# Patient Record
Sex: Female | Born: 1965 | Race: Black or African American | Hispanic: No | Marital: Married | Smoking: Never smoker
Health system: Southern US, Community
[De-identification: ages and names within clinical notes are randomized; demographics above are authoritative.]

## PROBLEM LIST (undated history)

## (undated) ENCOUNTER — Inpatient Hospital Stay: Admission: EM | Payer: Self-pay | Source: Home / Self Care

## (undated) DIAGNOSIS — G2581 Restless legs syndrome: Secondary | ICD-10-CM

## (undated) DIAGNOSIS — I10 Essential (primary) hypertension: Secondary | ICD-10-CM

## (undated) DIAGNOSIS — F419 Anxiety disorder, unspecified: Secondary | ICD-10-CM

## (undated) DIAGNOSIS — F329 Major depressive disorder, single episode, unspecified: Secondary | ICD-10-CM

## (undated) DIAGNOSIS — F32A Depression, unspecified: Secondary | ICD-10-CM

## (undated) DIAGNOSIS — Z972 Presence of dental prosthetic device (complete) (partial): Secondary | ICD-10-CM

## (undated) DIAGNOSIS — M1990 Unspecified osteoarthritis, unspecified site: Secondary | ICD-10-CM

## (undated) DIAGNOSIS — S7400XA Injury of sciatic nerve at hip and thigh level, unspecified leg, initial encounter: Secondary | ICD-10-CM

## (undated) DIAGNOSIS — K029 Dental caries, unspecified: Secondary | ICD-10-CM

## (undated) DIAGNOSIS — T754XXA Electrocution, initial encounter: Secondary | ICD-10-CM

## (undated) DIAGNOSIS — Z973 Presence of spectacles and contact lenses: Secondary | ICD-10-CM

## (undated) DIAGNOSIS — K219 Gastro-esophageal reflux disease without esophagitis: Secondary | ICD-10-CM

## (undated) DIAGNOSIS — N809 Endometriosis, unspecified: Secondary | ICD-10-CM

## (undated) HISTORY — DX: Depression, unspecified: F32.A

## (undated) HISTORY — PX: TUBAL LIGATION: SHX77

## (undated) HISTORY — DX: Major depressive disorder, single episode, unspecified: F32.9

## (undated) HISTORY — PX: MULTIPLE TOOTH EXTRACTIONS: SHX2053

## (undated) HISTORY — DX: Anxiety disorder, unspecified: F41.9

---

## 1999-02-25 ENCOUNTER — Other Ambulatory Visit: Admission: RE | Admit: 1999-02-25 | Discharge: 1999-02-25 | Payer: Self-pay | Admitting: Obstetrics

## 1999-03-14 ENCOUNTER — Observation Stay (HOSPITAL_COMMUNITY): Admission: AD | Admit: 1999-03-14 | Discharge: 1999-03-15 | Payer: Self-pay | Admitting: Obstetrics

## 1999-03-22 ENCOUNTER — Observation Stay (HOSPITAL_COMMUNITY): Admission: AD | Admit: 1999-03-22 | Discharge: 1999-03-23 | Payer: Self-pay | Admitting: Obstetrics

## 1999-04-02 ENCOUNTER — Observation Stay (HOSPITAL_COMMUNITY): Admission: AD | Admit: 1999-04-02 | Discharge: 1999-04-03 | Payer: Self-pay | Admitting: Obstetrics

## 1999-06-21 ENCOUNTER — Inpatient Hospital Stay (HOSPITAL_COMMUNITY): Admission: AD | Admit: 1999-06-21 | Discharge: 1999-06-21 | Payer: Self-pay | Admitting: Obstetrics

## 1999-10-05 ENCOUNTER — Inpatient Hospital Stay (HOSPITAL_COMMUNITY): Admission: AD | Admit: 1999-10-05 | Discharge: 1999-10-05 | Payer: Self-pay | Admitting: Obstetrics

## 1999-10-06 ENCOUNTER — Inpatient Hospital Stay (HOSPITAL_COMMUNITY): Admission: AD | Admit: 1999-10-06 | Discharge: 1999-10-06 | Payer: Self-pay | Admitting: Obstetrics

## 1999-10-20 ENCOUNTER — Inpatient Hospital Stay (HOSPITAL_COMMUNITY): Admission: AD | Admit: 1999-10-20 | Discharge: 1999-10-23 | Payer: Self-pay | Admitting: Obstetrics

## 2000-02-16 ENCOUNTER — Emergency Department (HOSPITAL_COMMUNITY): Admission: EM | Admit: 2000-02-16 | Discharge: 2000-02-16 | Payer: Self-pay

## 2002-11-20 ENCOUNTER — Inpatient Hospital Stay (HOSPITAL_COMMUNITY): Admission: AD | Admit: 2002-11-20 | Discharge: 2002-11-22 | Payer: Self-pay | Admitting: Obstetrics

## 2002-11-21 ENCOUNTER — Encounter: Payer: Self-pay | Admitting: Obstetrics

## 2002-11-21 ENCOUNTER — Other Ambulatory Visit: Admission: RE | Admit: 2002-11-21 | Discharge: 2002-11-21 | Payer: Self-pay | Admitting: Obstetrics

## 2003-06-06 ENCOUNTER — Inpatient Hospital Stay (HOSPITAL_COMMUNITY): Admission: AD | Admit: 2003-06-06 | Discharge: 2003-06-06 | Payer: Self-pay | Admitting: Obstetrics

## 2003-06-26 ENCOUNTER — Ambulatory Visit (HOSPITAL_COMMUNITY): Admission: RE | Admit: 2003-06-26 | Discharge: 2003-06-26 | Payer: Self-pay | Admitting: Obstetrics

## 2003-10-02 ENCOUNTER — Emergency Department (HOSPITAL_COMMUNITY): Admission: EM | Admit: 2003-10-02 | Discharge: 2003-10-02 | Payer: Self-pay | Admitting: Emergency Medicine

## 2004-01-14 ENCOUNTER — Inpatient Hospital Stay (HOSPITAL_COMMUNITY): Admission: AD | Admit: 2004-01-14 | Discharge: 2004-01-14 | Payer: Self-pay | Admitting: Obstetrics

## 2004-02-03 ENCOUNTER — Ambulatory Visit (HOSPITAL_COMMUNITY): Admission: RE | Admit: 2004-02-03 | Discharge: 2004-02-03 | Payer: Self-pay | Admitting: Obstetrics

## 2004-02-28 ENCOUNTER — Encounter: Admission: RE | Admit: 2004-02-28 | Discharge: 2004-02-28 | Payer: Self-pay | Admitting: Neurology

## 2004-09-11 ENCOUNTER — Emergency Department (HOSPITAL_COMMUNITY): Admission: EM | Admit: 2004-09-11 | Discharge: 2004-09-11 | Payer: Self-pay | Admitting: Emergency Medicine

## 2005-04-03 ENCOUNTER — Emergency Department (HOSPITAL_COMMUNITY): Admission: EM | Admit: 2005-04-03 | Discharge: 2005-04-03 | Payer: Self-pay | Admitting: Emergency Medicine

## 2007-04-13 ENCOUNTER — Encounter: Payer: Self-pay | Admitting: Anesthesiology

## 2007-04-29 ENCOUNTER — Encounter: Payer: Self-pay | Admitting: Anesthesiology

## 2010-12-23 ENCOUNTER — Encounter: Payer: Medicaid Other | Attending: Physical Medicine & Rehabilitation

## 2010-12-23 ENCOUNTER — Ambulatory Visit (HOSPITAL_BASED_OUTPATIENT_CLINIC_OR_DEPARTMENT_OTHER): Payer: Medicaid Other | Admitting: Physical Medicine & Rehabilitation

## 2010-12-23 DIAGNOSIS — F3289 Other specified depressive episodes: Secondary | ICD-10-CM | POA: Insufficient documentation

## 2010-12-23 DIAGNOSIS — M549 Dorsalgia, unspecified: Secondary | ICD-10-CM | POA: Insufficient documentation

## 2010-12-23 DIAGNOSIS — M79609 Pain in unspecified limb: Secondary | ICD-10-CM | POA: Insufficient documentation

## 2010-12-23 DIAGNOSIS — F329 Major depressive disorder, single episode, unspecified: Secondary | ICD-10-CM | POA: Insufficient documentation

## 2010-12-23 DIAGNOSIS — F411 Generalized anxiety disorder: Secondary | ICD-10-CM | POA: Insufficient documentation
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
## 2010-12-31 ENCOUNTER — Ambulatory Visit: Payer: Self-pay | Admitting: Physical Medicine & Rehabilitation

## 2011-01-24 ENCOUNTER — Encounter: Payer: Medicaid Other | Attending: Physical Medicine & Rehabilitation

## 2011-01-24 ENCOUNTER — Ambulatory Visit: Payer: Medicaid Other | Admitting: Physical Medicine & Rehabilitation

## 2011-01-24 DIAGNOSIS — M549 Dorsalgia, unspecified: Secondary | ICD-10-CM | POA: Insufficient documentation

## 2011-01-24 DIAGNOSIS — F411 Generalized anxiety disorder: Secondary | ICD-10-CM | POA: Insufficient documentation

## 2011-01-24 DIAGNOSIS — F329 Major depressive disorder, single episode, unspecified: Secondary | ICD-10-CM | POA: Insufficient documentation

## 2011-01-24 DIAGNOSIS — F3289 Other specified depressive episodes: Secondary | ICD-10-CM | POA: Insufficient documentation

## 2011-01-24 DIAGNOSIS — M79609 Pain in unspecified limb: Secondary | ICD-10-CM | POA: Insufficient documentation

## 2011-06-18 ENCOUNTER — Encounter (HOSPITAL_COMMUNITY): Payer: Self-pay | Admitting: *Deleted

## 2011-06-18 ENCOUNTER — Emergency Department (HOSPITAL_COMMUNITY): Payer: Medicaid Other

## 2011-06-18 ENCOUNTER — Emergency Department (HOSPITAL_COMMUNITY)
Admission: EM | Admit: 2011-06-18 | Discharge: 2011-06-18 | Disposition: A | Discharge status: Home / Self Care | Payer: Medicaid Other | Attending: Emergency Medicine | Admitting: Emergency Medicine

## 2011-06-18 DIAGNOSIS — S60221A Contusion of right hand, initial encounter: Secondary | ICD-10-CM

## 2011-06-18 DIAGNOSIS — M545 Low back pain, unspecified: Secondary | ICD-10-CM | POA: Insufficient documentation

## 2011-06-18 DIAGNOSIS — IMO0002 Reserved for concepts with insufficient information to code with codable children: Secondary | ICD-10-CM | POA: Insufficient documentation

## 2011-06-18 DIAGNOSIS — F341 Dysthymic disorder: Secondary | ICD-10-CM | POA: Insufficient documentation

## 2011-06-18 DIAGNOSIS — S161XXA Strain of muscle, fascia and tendon at neck level, initial encounter: Secondary | ICD-10-CM

## 2011-06-18 DIAGNOSIS — S335XXA Sprain of ligaments of lumbar spine, initial encounter: Secondary | ICD-10-CM | POA: Insufficient documentation

## 2011-06-18 DIAGNOSIS — Z79899 Other long term (current) drug therapy: Secondary | ICD-10-CM | POA: Insufficient documentation

## 2011-06-18 DIAGNOSIS — Y92410 Unspecified street and highway as the place of occurrence of the external cause: Secondary | ICD-10-CM | POA: Insufficient documentation

## 2011-06-18 DIAGNOSIS — S46912A Strain of unspecified muscle, fascia and tendon at shoulder and upper arm level, left arm, initial encounter: Secondary | ICD-10-CM

## 2011-06-18 DIAGNOSIS — S39012A Strain of muscle, fascia and tendon of lower back, initial encounter: Secondary | ICD-10-CM

## 2011-06-18 DIAGNOSIS — S139XXA Sprain of joints and ligaments of unspecified parts of neck, initial encounter: Secondary | ICD-10-CM | POA: Insufficient documentation

## 2011-06-18 DIAGNOSIS — S60229A Contusion of unspecified hand, initial encounter: Secondary | ICD-10-CM | POA: Insufficient documentation
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
## 2011-06-18 NOTE — ED Notes (Signed)
Pt resting quietly at the time. 6/10 pain at the time. Vital signs stable. No signs of distress noted.

## 2011-06-18 NOTE — Discharge Instructions (Signed)
SEEK IMMEDIATE MEDICAL ATTENTION IF: °New numbness, tingling, weakness, or problem with the use of your arms or legs.  °Severe back pain not relieved with medications.  °Change in bowel or bladder control.  °Increasing pain in any areas of the body (such as chest or abdominal pain).  °Shortness of breath, dizziness or fainting.  °Nausea (feeling sick to your stomach), vomiting, fever, or sweats. °You have neck pain, possibly from a cervical strain and/or pinched nerve.  °SEEK IMMEDIATE MEDICAL ATTENTION IF: °You develop difficulties swallowing or breathing.  °You have new or worse numbness, weakness, tingling, or movement problems in your arms or legs.  °You develop increasing pain which is uncontrolled with medications.  °You have change in bowel or bladder function, or other concerns. °
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
## 2011-07-02 ENCOUNTER — Emergency Department (HOSPITAL_COMMUNITY)
Admission: EM | Admit: 2011-07-02 | Discharge: 2011-07-02 | Disposition: A | Discharge status: Home / Self Care | Payer: No Typology Code available for payment source | Attending: Emergency Medicine | Admitting: Emergency Medicine

## 2011-07-02 ENCOUNTER — Encounter (HOSPITAL_COMMUNITY): Payer: Self-pay | Admitting: *Deleted

## 2011-07-02 ENCOUNTER — Emergency Department (HOSPITAL_COMMUNITY): Payer: No Typology Code available for payment source

## 2011-07-02 DIAGNOSIS — D219 Benign neoplasm of connective and other soft tissue, unspecified: Secondary | ICD-10-CM

## 2011-07-02 DIAGNOSIS — M545 Low back pain, unspecified: Secondary | ICD-10-CM | POA: Insufficient documentation

## 2011-07-02 DIAGNOSIS — M5430 Sciatica, unspecified side: Secondary | ICD-10-CM | POA: Insufficient documentation

## 2011-07-02 DIAGNOSIS — M79609 Pain in unspecified limb: Secondary | ICD-10-CM | POA: Insufficient documentation

## 2011-07-02 DIAGNOSIS — D259 Leiomyoma of uterus, unspecified: Secondary | ICD-10-CM | POA: Insufficient documentation

## 2011-07-02 DIAGNOSIS — R109 Unspecified abdominal pain: Secondary | ICD-10-CM | POA: Insufficient documentation

## 2011-07-02 LAB — BASIC METABOLIC PANEL
BUN: 10 mg/dL (ref 6–23)
CO2: 28 mEq/L (ref 19–32)
Calcium: 9 mg/dL (ref 8.4–10.5)
Chloride: 103 mEq/L (ref 96–112)
Creatinine, Ser: 0.64 mg/dL (ref 0.50–1.10)
GFR calc Af Amer: >90 mL/min (ref >90)
GFR calc non Af Amer: >90 mL/min (ref >90)
Glucose, Bld: 89 mg/dL (ref 70–99)
Potassium: 3.7 mEq/L (ref 3.5–5.1)
Sodium: 137 mEq/L (ref 135–145)

## 2011-07-02 LAB — DIFFERENTIAL
Basophils Absolute: 0 10*3/uL (ref 0.0–0.1)
Basophils Relative: 0 % (ref 0–1)
Eosinophils Absolute: 0.5 10*3/uL (ref 0.0–0.7)
Eosinophils Relative: 5 % (ref 0–5)
Lymphocytes Relative: 23 % (ref 12–46)
Lymphs Abs: 2.3 10*3/uL (ref 0.7–4.0)
Monocytes Absolute: 0.9 10*3/uL (ref 0.1–1.0)
Monocytes Relative: 9 % (ref 3–12)
Neutro Abs: 6.1 10*3/uL (ref 1.7–7.7)
Neutrophils Relative %: 62 % (ref 43–77)

## 2011-07-02 LAB — CBC
HCT: 39.9 % (ref 36.0–46.0)
Hemoglobin: 12.9 g/dL (ref 12.0–15.0)
MCH: 26.1 pg (ref 26.0–34.0)
MCHC: 32.3 g/dL (ref 30.0–36.0)
MCV: 80.8 fL (ref 78.0–100.0)
Platelets: 306 10*3/uL (ref 150–400)
RBC: 4.94 MIL/uL (ref 3.87–5.11)
RDW: 15.1 % (ref 11.5–15.5)
WBC: 9.8 10*3/uL (ref 4.0–10.5)

## 2011-07-02 MED ORDER — PREDNISONE 20 MG PO TABS: ORAL_TABLET | ORAL | Status: DC

## 2011-07-02 MED ORDER — HYDROMORPHONE HCL PF 1 MG/ML IJ SOLN
1.0000 mg | Freq: Once | INTRAMUSCULAR | Status: AC
  Administered 2011-07-02: 1 mg via INTRAVENOUS
  Filled 2011-07-02: qty 1

## 2011-07-02 MED ORDER — IOHEXOL 300 MG/ML  SOLN
20.0000 mL | INTRAMUSCULAR | Status: AC
  Administered 2011-07-02: 20 mL via ORAL

## 2011-07-02 MED ORDER — TRAMADOL HCL 50 MG PO TABS: 50.0000 mg | ORAL_TABLET | Freq: Four times a day (QID) | ORAL | Status: AC | PRN

## 2011-07-02 MED ORDER — OXYCODONE-ACETAMINOPHEN 5-325 MG PO TABS: 1.0000 | ORAL_TABLET | Freq: Four times a day (QID) | ORAL | Status: AC | PRN

## 2011-07-02 MED ORDER — KETOROLAC TROMETHAMINE 30 MG/ML IJ SOLN
30.0000 mg | Freq: Once | INTRAMUSCULAR | Status: AC
  Administered 2011-07-02: 30 mg via INTRAVENOUS
  Filled 2011-07-02: qty 1

## 2011-07-02 MED ORDER — ONDANSETRON HCL 4 MG/2ML IJ SOLN
4.0000 mg | Freq: Once | INTRAMUSCULAR | Status: AC
  Administered 2011-07-02: 4 mg via INTRAVENOUS
  Filled 2011-07-02: qty 2

## 2011-07-02 MED ORDER — LORAZEPAM 2 MG/ML IJ SOLN
1.0000 mg | Freq: Once | INTRAMUSCULAR | Status: AC
  Administered 2011-07-02: 1 mg via INTRAVENOUS
  Filled 2011-07-02: qty 1

## 2011-07-02 MED ORDER — HYDROMORPHONE HCL PF 1 MG/ML IJ SOLN
INTRAMUSCULAR | Status: AC
  Administered 2011-07-02: 12:00:00
  Filled 2011-07-02: qty 1

## 2011-07-02 MED ORDER — HYDROMORPHONE HCL PF 2 MG/ML IJ SOLN
INTRAMUSCULAR | Status: AC
  Filled 2011-07-02: qty 1

## 2011-07-02 MED ORDER — IOHEXOL 300 MG/ML  SOLN
100.0000 mL | Freq: Once | INTRAMUSCULAR | Status: AC | PRN
  Administered 2011-07-02: 100 mL via INTRAVENOUS

## 2011-07-02 MED ORDER — METHYLPREDNISOLONE SODIUM SUCC 125 MG IJ SOLR
125.0000 mg | Freq: Once | INTRAMUSCULAR | Status: AC
  Administered 2011-07-02: 125 mg via INTRAVENOUS
  Filled 2011-07-02: qty 2
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
## 2011-07-02 NOTE — ED Notes (Signed)
Pt reports that she is not having any relief from first round of pain medication. Dr. Estell Harpin informed and given V.O for more pain medication.
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
## 2011-07-02 NOTE — Discharge Instructions (Signed)
Follow up with your md in 1 week for recheck °

## 2011-07-02 NOTE — ED Notes (Signed)
Pt reports that her pain is still 6/10 with minimal relief.  Explained the mechanism of action and duration of the medications that she received and is prescribed.  Pt expresses verbal understanding.  Daughter at bedside to transport pt home due Narcotic administration.
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
## 2011-07-02 NOTE — ED Notes (Signed)
Patient transported to Ultrasound via wheelchair.

## 2011-07-30 ENCOUNTER — Emergency Department (HOSPITAL_COMMUNITY)
Admission: EM | Admit: 2011-07-30 | Discharge: 2011-07-31 | Disposition: A | Discharge status: Home / Self Care | Payer: No Typology Code available for payment source | Attending: Emergency Medicine | Admitting: Emergency Medicine

## 2011-07-30 ENCOUNTER — Encounter (HOSPITAL_COMMUNITY): Payer: Self-pay | Admitting: Emergency Medicine

## 2011-07-30 DIAGNOSIS — M545 Low back pain, unspecified: Secondary | ICD-10-CM | POA: Insufficient documentation

## 2011-07-30 DIAGNOSIS — M79609 Pain in unspecified limb: Secondary | ICD-10-CM | POA: Insufficient documentation

## 2011-07-30 DIAGNOSIS — G8929 Other chronic pain: Secondary | ICD-10-CM | POA: Insufficient documentation

## 2011-07-30 DIAGNOSIS — F329 Major depressive disorder, single episode, unspecified: Secondary | ICD-10-CM | POA: Insufficient documentation

## 2011-07-30 DIAGNOSIS — M549 Dorsalgia, unspecified: Secondary | ICD-10-CM | POA: Insufficient documentation

## 2011-07-30 DIAGNOSIS — S7400XA Injury of sciatic nerve at hip and thigh level, unspecified leg, initial encounter: Secondary | ICD-10-CM | POA: Insufficient documentation

## 2011-07-30 DIAGNOSIS — F3289 Other specified depressive episodes: Secondary | ICD-10-CM | POA: Insufficient documentation

## 2011-07-30 DIAGNOSIS — F411 Generalized anxiety disorder: Secondary | ICD-10-CM | POA: Insufficient documentation

## 2011-07-30 HISTORY — DX: Injury of sciatic nerve at hip and thigh level, unspecified leg, initial encounter: S74.00XA

## 2011-07-30 MED ORDER — DIAZEPAM 5 MG PO TABS
10.0000 mg | ORAL_TABLET | Freq: Once | ORAL | Status: AC
  Administered 2011-07-30: 10 mg via ORAL
  Filled 2011-07-30 (×2): qty 1

## 2011-07-30 MED ORDER — KETOROLAC TROMETHAMINE 30 MG/ML IJ SOLN
30.0000 mg | Freq: Once | INTRAMUSCULAR | Status: AC
  Administered 2011-07-30: 30 mg via INTRAMUSCULAR
  Filled 2011-07-30: qty 1

## 2011-07-30 MED ORDER — HYDROMORPHONE HCL PF 1 MG/ML IJ SOLN
1.0000 mg | Freq: Once | INTRAMUSCULAR | Status: AC
  Administered 2011-07-30: 1 mg via INTRAMUSCULAR
  Filled 2011-07-30: qty 1

## 2011-07-30 NOTE — ED Notes (Signed)
Provider at bedside
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
## 2011-07-30 NOTE — ED Notes (Signed)
Pt given pain med im .  She wqnted iv med.  Hyperventilating and moving around on the stretcher constanttly.

## 2011-07-30 NOTE — ED Notes (Signed)
The pt is asleep for the past 45 minutes

## 2011-07-31 NOTE — ED Provider Notes (Signed)
Medical screening examination/treatment/procedure(s) were performed by non-physician practitioner and as supervising physician I was immediately available for consultation/collaboration.  Sarra Rachels R. Felicia Both, MD 07/31/11 2317 

## 2011-11-15 ENCOUNTER — Other Ambulatory Visit: Payer: Self-pay | Admitting: Neurosurgery

## 2011-11-15 DIAGNOSIS — M545 Low back pain, unspecified: Secondary | ICD-10-CM

## 2011-11-18 ENCOUNTER — Emergency Department (HOSPITAL_COMMUNITY)
Admission: EM | Admit: 2011-11-18 | Discharge: 2011-11-18 | Disposition: A | Discharge status: Home Health | Payer: No Typology Code available for payment source | Attending: Emergency Medicine | Admitting: Emergency Medicine

## 2011-11-18 ENCOUNTER — Encounter (HOSPITAL_COMMUNITY): Payer: Self-pay | Admitting: Emergency Medicine

## 2011-11-18 DIAGNOSIS — G8929 Other chronic pain: Secondary | ICD-10-CM | POA: Insufficient documentation

## 2011-11-18 DIAGNOSIS — Z79899 Other long term (current) drug therapy: Secondary | ICD-10-CM | POA: Insufficient documentation

## 2011-11-18 DIAGNOSIS — M549 Dorsalgia, unspecified: Secondary | ICD-10-CM | POA: Insufficient documentation

## 2011-11-18 MED ORDER — KETOROLAC TROMETHAMINE 60 MG/2ML IM SOLN
60.0000 mg | Freq: Once | INTRAMUSCULAR | Status: AC
  Administered 2011-11-18: 60 mg via INTRAMUSCULAR
  Filled 2011-11-18: qty 2

## 2011-11-18 MED ORDER — HYDROMORPHONE HCL PF 2 MG/ML IJ SOLN
2.0000 mg | Freq: Once | INTRAMUSCULAR | Status: AC
  Administered 2011-11-18: 2 mg via INTRAMUSCULAR
  Filled 2011-11-18: qty 1

## 2011-11-18 MED ORDER — DIAZEPAM 5 MG PO TABS
10.0000 mg | ORAL_TABLET | Freq: Once | ORAL | Status: AC
  Administered 2011-11-18: 10 mg via ORAL
  Filled 2011-11-18: qty 2
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
## 2011-11-18 NOTE — ED Provider Notes (Signed)
Medical screening examination/treatment/procedure(s) were performed by non-physician practitioner and as supervising physician I was immediately available for consultation/collaboration. Devoria Albe, MD, FACEP   Ward Givens, MD 11/18/11 [phone]
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
## 2011-11-22 ENCOUNTER — Encounter (HOSPITAL_COMMUNITY): Payer: Self-pay

## 2011-11-22 ENCOUNTER — Emergency Department (HOSPITAL_COMMUNITY)
Admission: EM | Admit: 2011-11-22 | Discharge: 2011-11-22 | Disposition: A | Discharge status: Home / Self Care | Payer: Medicaid Other | Attending: Emergency Medicine | Admitting: Emergency Medicine

## 2011-11-22 DIAGNOSIS — F411 Generalized anxiety disorder: Secondary | ICD-10-CM | POA: Insufficient documentation

## 2011-11-22 DIAGNOSIS — R079 Chest pain, unspecified: Secondary | ICD-10-CM | POA: Insufficient documentation

## 2011-11-22 DIAGNOSIS — G8929 Other chronic pain: Secondary | ICD-10-CM | POA: Insufficient documentation

## 2011-11-22 DIAGNOSIS — F329 Major depressive disorder, single episode, unspecified: Secondary | ICD-10-CM | POA: Insufficient documentation

## 2011-11-22 DIAGNOSIS — M549 Dorsalgia, unspecified: Secondary | ICD-10-CM | POA: Insufficient documentation

## 2011-11-22 DIAGNOSIS — F3289 Other specified depressive episodes: Secondary | ICD-10-CM | POA: Insufficient documentation

## 2011-11-22 LAB — CBC WITH DIFFERENTIAL/PLATELET
Basophils Absolute: 0 10*3/uL (ref 0.0–0.1)
Basophils Relative: 0 % (ref 0–1)
Eosinophils Absolute: 0.1 10*3/uL (ref 0.0–0.7)
Eosinophils Relative: 1 % (ref 0–5)
HCT: 41.9 % (ref 36.0–46.0)
Hemoglobin: 13.8 g/dL (ref 12.0–15.0)
Lymphocytes Relative: 12 % (ref 12–46)
Lymphs Abs: 1.3 10*3/uL (ref 0.7–4.0)
MCH: 26.8 pg (ref 26.0–34.0)
MCHC: 32.9 g/dL (ref 30.0–36.0)
MCV: 81.4 fL (ref 78.0–100.0)
Monocytes Absolute: 0.3 10*3/uL (ref 0.1–1.0)
Monocytes Relative: 3 % (ref 3–12)
Neutro Abs: 9.1 10*3/uL — ABNORMAL HIGH (ref 1.7–7.7)
Neutrophils Relative %: 84 % — ABNORMAL HIGH (ref 43–77)
Platelets: 344 10*3/uL (ref 150–400)
RBC: 5.15 MIL/uL — ABNORMAL HIGH (ref 3.87–5.11)
RDW: 15 % (ref 11.5–15.5)
WBC: 10.8 10*3/uL — ABNORMAL HIGH (ref 4.0–10.5)

## 2011-11-22 LAB — POCT I-STAT, CHEM 8
BUN: 6 mg/dL (ref 6–23)
Calcium, Ion: 1.21 mmol/L (ref 1.12–1.23)
Chloride: 105 mEq/L (ref 96–112)
Creatinine, Ser: 0.8 mg/dL (ref 0.50–1.10)
Glucose, Bld: 130 mg/dL — ABNORMAL HIGH (ref 70–99)
HCT: 47 % — ABNORMAL HIGH (ref 36.0–46.0)
Hemoglobin: 16 g/dL — ABNORMAL HIGH (ref 12.0–15.0)
Potassium: 3.9 mEq/L (ref 3.5–5.1)
Sodium: 142 mEq/L (ref 135–145)
TCO2: 24 mmol/L (ref 0–100)

## 2011-11-22 LAB — POCT I-STAT TROPONIN I: Troponin i, poc: 0.03 ng/mL (ref 0.00–0.08)

## 2011-11-22 MED ORDER — HYDROMORPHONE HCL PF 2 MG/ML IJ SOLN
2.0000 mg | Freq: Once | INTRAMUSCULAR | Status: AC
  Administered 2011-11-22: 2 mg via INTRAMUSCULAR
  Filled 2011-11-22: qty 1

## 2011-11-22 NOTE — ED Notes (Signed)
Has not taken mediciatons in four days.

## 2011-11-22 NOTE — ED Notes (Signed)
Pain across epigastrim - onset 0400 this am - also endorses n/v, sob, and diaphoresis; states was "breathing fast" and subsequently developed rgt hand numbness; now c/o pain to mid epigastrim rad. Into mid back; states applied Lidoderm patches x2 with no relief; second complaint - rgt-sided h/a x2 days - constant - has not tried any home meds; seen in ED x4 days ago for for LLE and left-sided "body pain"; is a pain management clinic pt - last seen 2 weeks ago - next appt Sept 4

## 2011-11-22 NOTE — ED Notes (Signed)
Unable to urinate at this time - aware of need for specimen
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
## 2011-11-29 ENCOUNTER — Other Ambulatory Visit: Payer: Medicaid Other

## 2011-12-01 ENCOUNTER — Inpatient Hospital Stay: Admission: RE | Admit: 2011-12-01 | Payer: Medicaid Other | Source: Ambulatory Visit

## 2012-01-31 ENCOUNTER — Emergency Department (HOSPITAL_COMMUNITY)
Admission: EM | Admit: 2012-01-31 | Discharge: 2012-01-31 | Discharge status: Against Medical Advice | Payer: Medicaid Other | Attending: Emergency Medicine | Admitting: Emergency Medicine

## 2012-01-31 DIAGNOSIS — Z7689 Persons encountering health services in other specified circumstances: Secondary | ICD-10-CM | POA: Insufficient documentation

## 2012-01-31 NOTE — ED Notes (Signed)
Pt attempted to call in wait room x 4.

## 2012-01-31 NOTE — ED Notes (Signed)
Attempted to call pt in wait room. No answer x 2

## 2012-02-14 ENCOUNTER — Ambulatory Visit
Admission: RE | Admit: 2012-02-14 | Discharge: 2012-02-14 | Disposition: A | Discharge status: Home / Self Care | Payer: Medicaid Other | Source: Ambulatory Visit | Attending: Neurosurgery | Admitting: Neurosurgery

## 2012-02-14 DIAGNOSIS — M545 Low back pain, unspecified: Secondary | ICD-10-CM

## 2012-05-11 ENCOUNTER — Encounter (HOSPITAL_COMMUNITY): Payer: Self-pay | Admitting: Certified Registered Nurse Anesthetist

## 2012-05-11 ENCOUNTER — Emergency Department (HOSPITAL_COMMUNITY): Payer: Medicaid Other

## 2012-05-11 ENCOUNTER — Encounter (HOSPITAL_COMMUNITY): Payer: Self-pay | Admitting: Emergency Medicine

## 2012-05-11 ENCOUNTER — Encounter (HOSPITAL_COMMUNITY)
Admission: EM | Disposition: A | Discharge status: Home / Self Care | Payer: Self-pay | Source: Home / Self Care | Attending: Emergency Medicine

## 2012-05-11 ENCOUNTER — Observation Stay (HOSPITAL_COMMUNITY)
Admission: EM | Admit: 2012-05-11 | Discharge: 2012-05-12 | Disposition: A | Discharge status: Home / Self Care | Payer: Medicaid Other | Attending: Surgery | Admitting: Surgery

## 2012-05-11 ENCOUNTER — Emergency Department (HOSPITAL_COMMUNITY): Payer: Medicaid Other | Admitting: Certified Registered Nurse Anesthetist

## 2012-05-11 DIAGNOSIS — K8010 Calculus of gallbladder with chronic cholecystitis without obstruction: Secondary | ICD-10-CM | POA: Insufficient documentation

## 2012-05-11 DIAGNOSIS — Z9049 Acquired absence of other specified parts of digestive tract: Secondary | ICD-10-CM | POA: Insufficient documentation

## 2012-05-11 DIAGNOSIS — K8000 Calculus of gallbladder with acute cholecystitis without obstruction: Principal | ICD-10-CM | POA: Insufficient documentation

## 2012-05-11 DIAGNOSIS — K819 Cholecystitis, unspecified: Secondary | ICD-10-CM

## 2012-05-11 DIAGNOSIS — R030 Elevated blood-pressure reading, without diagnosis of hypertension: Secondary | ICD-10-CM | POA: Insufficient documentation

## 2012-05-11 DIAGNOSIS — R1013 Epigastric pain: Secondary | ICD-10-CM | POA: Insufficient documentation

## 2012-05-11 DIAGNOSIS — G8929 Other chronic pain: Secondary | ICD-10-CM | POA: Insufficient documentation

## 2012-05-11 HISTORY — DX: Electrocution, initial encounter: T75.4XXA

## 2012-05-11 HISTORY — PX: CHOLECYSTECTOMY: SHX55

## 2012-05-11 HISTORY — DX: Essential (primary) hypertension: I10

## 2012-05-11 LAB — COMPREHENSIVE METABOLIC PANEL
ALT: 10 U/L (ref 0–35)
AST: 15 U/L (ref 0–37)
Albumin: 3.6 g/dL (ref 3.5–5.2)
Alkaline Phosphatase: 57 U/L (ref 39–117)
BUN: 8 mg/dL (ref 6–23)
CO2: 31 mEq/L (ref 19–32)
Calcium: 9 mg/dL (ref 8.4–10.5)
Chloride: 105 mEq/L (ref 96–112)
Creatinine, Ser: 0.64 mg/dL (ref 0.50–1.10)
GFR calc Af Amer: >90 mL/min (ref >90)
GFR calc non Af Amer: >90 mL/min (ref >90)
Glucose, Bld: 119 mg/dL — ABNORMAL HIGH (ref 70–99)
Potassium: 3.3 mEq/L — ABNORMAL LOW (ref 3.5–5.1)
Sodium: 143 mEq/L (ref 135–145)
Total Bilirubin: 0.1 mg/dL — ABNORMAL LOW (ref 0.3–1.2)
Total Protein: 7.4 g/dL (ref 6.0–8.3)

## 2012-05-11 LAB — CBC WITH DIFFERENTIAL/PLATELET
Basophils Absolute: 0.2 10*3/uL — ABNORMAL HIGH (ref 0.0–0.1)
Basophils Relative: 2 % — ABNORMAL HIGH (ref 0–1)
Eosinophils Absolute: 0.2 10*3/uL (ref 0.0–0.7)
Eosinophils Relative: 2 % (ref 0–5)
HCT: 38.9 % (ref 36.0–46.0)
Hemoglobin: 12.4 g/dL (ref 12.0–15.0)
Lymphocytes Relative: 22 % (ref 12–46)
Lymphs Abs: 2.2 10*3/uL (ref 0.7–4.0)
MCH: 25.8 pg — ABNORMAL LOW (ref 26.0–34.0)
MCHC: 31.9 g/dL (ref 30.0–36.0)
MCV: 81 fL (ref 78.0–100.0)
Monocytes Absolute: 0.6 10*3/uL (ref 0.1–1.0)
Monocytes Relative: 7 % (ref 3–12)
Neutro Abs: 6.6 10*3/uL (ref 1.7–7.7)
Neutrophils Relative %: 68 % (ref 43–77)
Platelets: 312 10*3/uL (ref 150–400)
RBC: 4.8 MIL/uL (ref 3.87–5.11)
RDW: 15 % (ref 11.5–15.5)
WBC: 9.7 10*3/uL (ref 4.0–10.5)

## 2012-05-11 LAB — D-DIMER, QUANTITATIVE (NOT AT ARMC): D-Dimer, Quant: <0.27 ug/mL-FEU (ref 0.00–0.48)

## 2012-05-11 LAB — URINALYSIS, ROUTINE W REFLEX MICROSCOPIC
Bilirubin Urine: NEGATIVE
Glucose, UA: NEGATIVE mg/dL
Hgb urine dipstick: NEGATIVE
Ketones, ur: NEGATIVE mg/dL
Nitrite: NEGATIVE
Protein, ur: NEGATIVE mg/dL
Specific Gravity, Urine: 1.022 (ref 1.005–1.030)
Urobilinogen, UA: 0.2 mg/dL (ref 0.0–1.0)
pH: 6.5 (ref 5.0–8.0)

## 2012-05-11 LAB — CBC
HCT: 38.5 % (ref 36.0–46.0)
Hemoglobin: 12.1 g/dL (ref 12.0–15.0)
MCH: 25.5 pg — ABNORMAL LOW (ref 26.0–34.0)
MCHC: 31.4 g/dL (ref 30.0–36.0)
MCV: 81.1 fL (ref 78.0–100.0)
Platelets: 297 10*3/uL (ref 150–400)
RBC: 4.75 MIL/uL (ref 3.87–5.11)
RDW: 15.1 % (ref 11.5–15.5)
WBC: 9.2 10*3/uL (ref 4.0–10.5)

## 2012-05-11 LAB — URINE MICROSCOPIC-ADD ON

## 2012-05-11 LAB — CREATININE, SERUM
Creatinine, Ser: 0.6 mg/dL (ref 0.50–1.10)
GFR calc Af Amer: >90 mL/min (ref >90)
GFR calc non Af Amer: >90 mL/min (ref >90)

## 2012-05-11 LAB — POCT I-STAT TROPONIN I: Troponin i, poc: 0 ng/mL (ref 0.00–0.08)

## 2012-05-11 LAB — LIPASE, BLOOD: Lipase: 27 U/L (ref 11–59)

## 2012-05-11 SURGERY — LAPAROSCOPIC CHOLECYSTECTOMY WITH INTRAOPERATIVE CHOLANGIOGRAM
Anesthesia: General | Site: Abdomen | Wound class: Contaminated

## 2012-05-11 MED ORDER — DIPHENHYDRAMINE HCL 25 MG PO CAPS
50.0000 mg | ORAL_CAPSULE | Freq: Four times a day (QID) | ORAL | Status: DC | PRN
  Administered 2012-05-11: 50 mg via ORAL
  Filled 2012-05-11 (×2): qty 1

## 2012-05-11 MED ORDER — SODIUM CHLORIDE 0.9 % IV BOLUS (SEPSIS)
1000.0000 mL | Freq: Once | INTRAVENOUS | Status: AC
  Administered 2012-05-11: 1000 mL via INTRAVENOUS

## 2012-05-11 MED ORDER — ONDANSETRON HCL 4 MG/2ML IJ SOLN
4.0000 mg | Freq: Once | INTRAMUSCULAR | Status: AC
Start: 2012-05-11 — End: 2012-05-11
  Administered 2012-05-11: 4 mg via INTRAVENOUS

## 2012-05-11 MED ORDER — ONDANSETRON HCL 4 MG PO TABS: 4.0000 mg | ORAL_TABLET | Freq: Four times a day (QID) | ORAL | Status: DC | PRN

## 2012-05-11 MED ORDER — GI COCKTAIL ~~LOC~~
30.0000 mL | Freq: Once | ORAL | Status: AC
  Administered 2012-05-11: 30 mL via ORAL
  Filled 2012-05-11: qty 30

## 2012-05-11 MED ORDER — PROMETHAZINE HCL 25 MG/ML IJ SOLN: 6.2500 mg | INTRAMUSCULAR | Status: DC | PRN

## 2012-05-11 MED ORDER — HYDROMORPHONE HCL PF 1 MG/ML IJ SOLN
1.0000 mg | Freq: Once | INTRAMUSCULAR | Status: AC
  Administered 2012-05-11: 1 mg via INTRAVENOUS
  Filled 2012-05-11: qty 1

## 2012-05-11 MED ORDER — HYDROMORPHONE HCL PF 1 MG/ML IJ SOLN
1.0000 mg | Freq: Once | INTRAMUSCULAR | Status: AC
  Administered 2012-05-11: 1 mg via INTRAVENOUS

## 2012-05-11 MED ORDER — ONDANSETRON HCL 4 MG/2ML IJ SOLN: 4.0000 mg | Freq: Four times a day (QID) | INTRAMUSCULAR | Status: DC | PRN

## 2012-05-11 MED ORDER — ONDANSETRON HCL 4 MG/2ML IJ SOLN
4.0000 mg | Freq: Once | INTRAMUSCULAR | Status: AC
  Administered 2012-05-11: 4 mg via INTRAVENOUS
  Filled 2012-05-11: qty 2

## 2012-05-11 MED ORDER — AMLODIPINE BESYLATE 10 MG PO TABS
10.0000 mg | ORAL_TABLET | Freq: Every day | ORAL | Status: DC
  Administered 2012-05-11 – 2012-05-12 (×2): 10 mg via ORAL
  Filled 2012-05-11 (×2): qty 1

## 2012-05-11 MED ORDER — OXYCODONE HCL 5 MG/5ML PO SOLN: 5.0000 mg | Freq: Once | ORAL | Status: DC | PRN

## 2012-05-11 MED ORDER — GLYCOPYRROLATE 0.2 MG/ML IJ SOLN
INTRAMUSCULAR | Status: DC | PRN
  Administered 2012-05-11: .6 mg via INTRAVENOUS

## 2012-05-11 MED ORDER — HYDROMORPHONE HCL PF 1 MG/ML IJ SOLN
INTRAMUSCULAR | Status: AC
  Filled 2012-05-11: qty 1

## 2012-05-11 MED ORDER — WHITE PETROLATUM GEL
Status: AC
  Administered 2012-05-11: 20:00:00
  Filled 2012-05-11: qty 5

## 2012-05-11 MED ORDER — BUPIVACAINE-EPINEPHRINE 0.25% -1:200000 IJ SOLN
INTRAMUSCULAR | Status: DC | PRN
  Administered 2012-05-11: 27 mL

## 2012-05-11 MED ORDER — MIDAZOLAM HCL 5 MG/5ML IJ SOLN
INTRAMUSCULAR | Status: DC | PRN
  Administered 2012-05-11: 2 mg via INTRAVENOUS

## 2012-05-11 MED ORDER — HYDROMORPHONE HCL PF 1 MG/ML IJ SOLN
0.2500 mg | INTRAMUSCULAR | Status: DC | PRN
  Administered 2012-05-11 (×2): 0.5 mg via INTRAVENOUS

## 2012-05-11 MED ORDER — PROPOFOL 10 MG/ML IV BOLUS
INTRAVENOUS | Status: DC | PRN
  Administered 2012-05-11: 160 mg via INTRAVENOUS

## 2012-05-11 MED ORDER — LORAZEPAM 2 MG/ML IJ SOLN
1.0000 mg | Freq: Once | INTRAMUSCULAR | Status: AC
  Administered 2012-05-11: 1 mg via INTRAVENOUS
  Filled 2012-05-11: qty 1

## 2012-05-11 MED ORDER — HYDROCODONE-ACETAMINOPHEN 5-325 MG PO TABS
1.0000 | ORAL_TABLET | ORAL | Status: DC | PRN
  Administered 2012-05-12: 1 via ORAL
  Filled 2012-05-11: qty 1

## 2012-05-11 MED ORDER — ACETAMINOPHEN 10 MG/ML IV SOLN
1000.0000 mg | Freq: Four times a day (QID) | INTRAVENOUS | Status: AC
  Administered 2012-05-11: 1000 mg via INTRAVENOUS
  Filled 2012-05-11 (×4): qty 100

## 2012-05-11 MED ORDER — OXYCODONE-ACETAMINOPHEN 10-325 MG PO TABS: 1.0000 | ORAL_TABLET | ORAL | Status: DC | PRN

## 2012-05-11 MED ORDER — LACTATED RINGERS IV SOLN
INTRAVENOUS | Status: DC | PRN
  Administered 2012-05-11 (×2): via INTRAVENOUS

## 2012-05-11 MED ORDER — KCL IN DEXTROSE-NACL 20-5-0.45 MEQ/L-%-% IV SOLN
INTRAVENOUS | Status: DC
  Administered 2012-05-11 (×2): via INTRAVENOUS
  Filled 2012-05-11 (×5): qty 1000

## 2012-05-11 MED ORDER — GABAPENTIN 800 MG PO TABS
800.0000 mg | ORAL_TABLET | Freq: Three times a day (TID) | ORAL | Status: DC
  Filled 2012-05-11 (×2): qty 1

## 2012-05-11 MED ORDER — MEPERIDINE HCL 25 MG/ML IJ SOLN: 6.2500 mg | INTRAMUSCULAR | Status: DC | PRN

## 2012-05-11 MED ORDER — ONDANSETRON HCL 4 MG/2ML IJ SOLN
INTRAMUSCULAR | Status: AC
  Administered 2012-05-11: 4 mg via INTRAVENOUS
  Filled 2012-05-11: qty 2

## 2012-05-11 MED ORDER — LISINOPRIL-HYDROCHLOROTHIAZIDE 20-12.5 MG PO TABS: 1.0000 | ORAL_TABLET | Freq: Every day | ORAL | Status: DC

## 2012-05-11 MED ORDER — LISINOPRIL 20 MG PO TABS
20.0000 mg | ORAL_TABLET | Freq: Every day | ORAL | Status: DC
  Administered 2012-05-11 – 2012-05-12 (×2): 20 mg via ORAL
  Filled 2012-05-11 (×2): qty 1

## 2012-05-11 MED ORDER — GABAPENTIN 400 MG PO CAPS
800.0000 mg | ORAL_CAPSULE | Freq: Three times a day (TID) | ORAL | Status: DC
  Administered 2012-05-11 – 2012-05-12 (×3): 800 mg via ORAL
  Filled 2012-05-11 (×6): qty 2

## 2012-05-11 MED ORDER — DULOXETINE HCL 60 MG PO CPEP
60.0000 mg | ORAL_CAPSULE | Freq: Every day | ORAL | Status: DC
  Administered 2012-05-11 – 2012-05-12 (×2): 60 mg via ORAL
  Filled 2012-05-11 (×2): qty 1

## 2012-05-11 MED ORDER — CIPROFLOXACIN IN D5W 400 MG/200ML IV SOLN
400.0000 mg | INTRAVENOUS | Status: AC
  Administered 2012-05-11: 400 mg via INTRAVENOUS
  Filled 2012-05-11: qty 200

## 2012-05-11 MED ORDER — HYDROMORPHONE HCL PF 1 MG/ML IJ SOLN
0.5000 mg | INTRAMUSCULAR | Status: DC | PRN
  Administered 2012-05-11 – 2012-05-12 (×4): 1 mg via INTRAVENOUS
  Filled 2012-05-11 (×4): qty 1

## 2012-05-11 MED ORDER — FENTANYL CITRATE 0.05 MG/ML IJ SOLN
50.0000 ug | Freq: Once | INTRAMUSCULAR | Status: AC
  Administered 2012-05-11: 50 ug via INTRAVENOUS
  Filled 2012-05-11: qty 2

## 2012-05-11 MED ORDER — FENTANYL CITRATE 0.05 MG/ML IJ SOLN
INTRAMUSCULAR | Status: DC | PRN
  Administered 2012-05-11 (×4): 100 ug via INTRAVENOUS

## 2012-05-11 MED ORDER — IOHEXOL 300 MG/ML  SOLN
INTRAMUSCULAR | Status: DC | PRN
  Administered 2012-05-11: 10:00:00

## 2012-05-11 MED ORDER — ONDANSETRON HCL 4 MG/2ML IJ SOLN
INTRAMUSCULAR | Status: DC | PRN
  Administered 2012-05-11: 4 mg via INTRAVENOUS

## 2012-05-11 MED ORDER — FENTANYL CITRATE 0.05 MG/ML IJ SOLN
100.0000 ug | Freq: Once | INTRAMUSCULAR | Status: AC
  Administered 2012-05-11: 100 ug via INTRAVENOUS
  Filled 2012-05-11: qty 2

## 2012-05-11 MED ORDER — SODIUM CHLORIDE 0.9 % IR SOLN
Status: DC | PRN
  Administered 2012-05-11: 1

## 2012-05-11 MED ORDER — HYDROMORPHONE HCL PF 1 MG/ML IJ SOLN
INTRAMUSCULAR | Status: AC
  Administered 2012-05-11: 1 mg via INTRAVENOUS
  Filled 2012-05-11: qty 1

## 2012-05-11 MED ORDER — HEPARIN SODIUM (PORCINE) 5000 UNIT/ML IJ SOLN
5000.0000 [IU] | Freq: Three times a day (TID) | INTRAMUSCULAR | Status: DC
  Administered 2012-05-11 – 2012-05-12 (×2): 5000 [IU] via SUBCUTANEOUS
  Filled 2012-05-11 (×6): qty 1

## 2012-05-11 MED ORDER — LIDOCAINE HCL (CARDIAC) 20 MG/ML IV SOLN
INTRAVENOUS | Status: DC | PRN
  Administered 2012-05-11: 80 mg via INTRAVENOUS

## 2012-05-11 MED ORDER — OXYCODONE HCL 5 MG PO TABS
5.0000 mg | ORAL_TABLET | Freq: Four times a day (QID) | ORAL | Status: DC
  Administered 2012-05-11 (×3): 5 mg via ORAL
  Filled 2012-05-11 (×3): qty 1

## 2012-05-11 MED ORDER — LABETALOL HCL 5 MG/ML IV SOLN
INTRAVENOUS | Status: DC | PRN
  Administered 2012-05-11: 5 mg via INTRAVENOUS

## 2012-05-11 MED ORDER — ROCURONIUM BROMIDE 100 MG/10ML IV SOLN
INTRAVENOUS | Status: DC | PRN
  Administered 2012-05-11: 50 mg via INTRAVENOUS

## 2012-05-11 MED ORDER — HYDROCHLOROTHIAZIDE 12.5 MG PO CAPS
12.5000 mg | ORAL_CAPSULE | Freq: Every day | ORAL | Status: DC
  Administered 2012-05-11 – 2012-05-12 (×2): 12.5 mg via ORAL
  Filled 2012-05-11 (×2): qty 1

## 2012-05-11 MED ORDER — OXYCODONE-ACETAMINOPHEN 5-325 MG PO TABS
1.0000 | ORAL_TABLET | Freq: Four times a day (QID) | ORAL | Status: DC
  Administered 2012-05-11 (×3): 1 via ORAL
  Filled 2012-05-11 (×3): qty 1

## 2012-05-11 MED ORDER — KCL IN DEXTROSE-NACL 20-5-0.45 MEQ/L-%-% IV SOLN
INTRAVENOUS | Status: AC
  Filled 2012-05-11: qty 1000

## 2012-05-11 MED ORDER — OXYCODONE HCL 5 MG PO TABS: 5.0000 mg | ORAL_TABLET | Freq: Once | ORAL | Status: DC | PRN

## 2012-05-11 MED ORDER — NEOSTIGMINE METHYLSULFATE 1 MG/ML IJ SOLN
INTRAMUSCULAR | Status: DC | PRN
  Administered 2012-05-11: 4 mg via INTRAVENOUS

## 2012-05-11 SURGICAL SUPPLY — 53 items
ADH SKN CLS APL DERMABOND .7 (GAUZE/BANDAGES/DRESSINGS) ×1
APPLIER CLIP 5 13 M/L LIGAMAX5 (MISCELLANEOUS)
APPLIER CLIP ROT 10 11.4 M/L (STAPLE)
APR CLP MED LRG 11.4X10 (STAPLE)
APR CLP MED LRG 5 ANG JAW (MISCELLANEOUS)
BAG SPEC RTRVL LRG 6X4 10 (ENDOMECHANICALS) ×1
BLADE SURG ROTATE 9660 (MISCELLANEOUS) IMPLANT
CANISTER SUCTION 2500CC (MISCELLANEOUS) ×2 IMPLANT
CHLORAPREP W/TINT 26ML (MISCELLANEOUS) ×2 IMPLANT
CHOLANGIOGRAM CATH TAUT (CATHETERS) ×2 IMPLANT
CLIP APPLIE 5 13 M/L LIGAMAX5 (MISCELLANEOUS) IMPLANT
CLIP APPLIE ROT 10 11.4 M/L (STAPLE) IMPLANT
CLOTH BEACON ORANGE TIMEOUT ST (SAFETY) ×2 IMPLANT
COVER MAYO STAND STRL (DRAPES) ×2 IMPLANT
COVER SURGICAL LIGHT HANDLE (MISCELLANEOUS) ×2 IMPLANT
DECANTER SPIKE VIAL GLASS SM (MISCELLANEOUS) ×2 IMPLANT
DERMABOND ADVANCED (GAUZE/BANDAGES/DRESSINGS) ×1
DERMABOND ADVANCED .7 DNX12 (GAUZE/BANDAGES/DRESSINGS) ×1 IMPLANT
DRAPE C-ARM 42X72 X-RAY (DRAPES) ×2 IMPLANT
DRAPE UTILITY 15X26 W/TAPE STR (DRAPE) ×4 IMPLANT
ELECT REM PT RETURN 9FT ADLT (ELECTROSURGICAL) ×2
ELECTRODE REM PT RTRN 9FT ADLT (ELECTROSURGICAL) ×1 IMPLANT
FILTER SMOKE EVAC LAPAROSHD (FILTER) ×2 IMPLANT
GLOVE BIO SURGEON STRL SZ7.5 (GLOVE) ×2 IMPLANT
GLOVE BIOGEL PI IND STRL 7.0 (GLOVE) IMPLANT
GLOVE BIOGEL PI IND STRL 7.5 (GLOVE) IMPLANT
GLOVE BIOGEL PI INDICATOR 7.0 (GLOVE) ×1
GLOVE BIOGEL PI INDICATOR 7.5 (GLOVE) ×1
GLOVE SURG SIGNA 7.5 PF LTX (GLOVE) ×2 IMPLANT
GLOVE SURG SS PI 7.0 STRL IVOR (GLOVE) ×1 IMPLANT
GOWN PREVENTION PLUS XXLARGE (GOWN DISPOSABLE) ×1 IMPLANT
GOWN STRL NON-REIN LRG LVL3 (GOWN DISPOSABLE) ×5 IMPLANT
GOWN STRL REIN XL XLG (GOWN DISPOSABLE) ×2 IMPLANT
IV CATH 14GX2 1/4 (CATHETERS) ×2 IMPLANT
KIT BASIN OR (CUSTOM PROCEDURE TRAY) ×2 IMPLANT
KIT ROOM TURNOVER OR (KITS) ×2 IMPLANT
NS IRRIG 1000ML POUR BTL (IV SOLUTION) ×2 IMPLANT
PAD ARMBOARD 7.5X6 YLW CONV (MISCELLANEOUS) ×2 IMPLANT
POUCH SPECIMEN RETRIEVAL 10MM (ENDOMECHANICALS) ×2 IMPLANT
SCISSORS LAP 5X35 DISP (ENDOMECHANICALS) IMPLANT
SET IRRIG TUBING LAPAROSCOPIC (IRRIGATION / IRRIGATOR) ×2 IMPLANT
SPECIMEN JAR SMALL (MISCELLANEOUS) ×2 IMPLANT
STOPCOCK 4 WAY LG BORE MALE ST (IV SETS) ×2 IMPLANT
SUT VIC AB 5-0 PS2 18 (SUTURE) ×2 IMPLANT
SUT VICRYL 0 UR6 27IN ABS (SUTURE) ×1 IMPLANT
TOWEL OR 17X24 6PK STRL BLUE (TOWEL DISPOSABLE) ×2 IMPLANT
TOWEL OR 17X26 10 PK STRL BLUE (TOWEL DISPOSABLE) ×2 IMPLANT
TRAY LAPAROSCOPIC (CUSTOM PROCEDURE TRAY) ×2 IMPLANT
TROCAR XCEL BLUNT TIP 100MML (ENDOMECHANICALS) ×2 IMPLANT
TROCAR Z-THREAD FIOS 11X100 BL (TROCAR) ×1 IMPLANT
TROCAR Z-THREAD FIOS 5X100MM (TROCAR) ×4 IMPLANT
TUBING EXTENTION W/L.L. (IV SETS) ×2 IMPLANT
WATER STERILE IRR 1000ML POUR (IV SOLUTION) IMPLANT
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
## 2012-05-11 NOTE — ED Notes (Signed)
Dr. Ezzard Standing at the bedside to explain procedure

## 2012-05-11 NOTE — Transfer of Care (Signed)
Immediate Anesthesia Transfer of Care Note  Patient: Kim Skinner  Procedure(s) Performed: Procedure(s): LAPAROSCOPIC CHOLECYSTECTOMY WITH INTRAOPERATIVE CHOLANGIOGRAM (N/A)  Patient Location: PACU  Anesthesia Type:General  Level of Consciousness: awake, alert , oriented and patient cooperative  Airway & Oxygen Therapy: Patient Spontanous Breathing and Patient connected to nasal cannula oxygen  Post-op Assessment: Report given to PACU RN, Post -op Vital signs reviewed and stable and Patient moving all extremities X 4  Post vital signs: Reviewed and stable  Complications: No apparent anesthesia complications

## 2012-05-11 NOTE — Anesthesia Postprocedure Evaluation (Signed)
  Anesthesia Post-op Note  Patient: Kim Skinner  Procedure(s) Performed: Procedure(s): LAPAROSCOPIC CHOLECYSTECTOMY WITH INTRAOPERATIVE CHOLANGIOGRAM (N/A)  Patient Location: PACU  Anesthesia Type:General  Level of Consciousness: awake  Airway and Oxygen Therapy: Patient Spontanous Breathing  Post-op Pain: mild  Post-op Assessment: Post-op Vital signs reviewed  Post-op Vital Signs: stable  Complications: No apparent anesthesia complications

## 2012-05-11 NOTE — Op Note (Signed)
05/11/2012  11:28 AM  PATIENT:  Kim Skinner, 47 y.o., female, MRN: [id number]  PREOP DIAGNOSIS:  gallbladder disease  POSTOP DIAGNOSIS:   Edematous cholecystitis, cholelithiasis  PROCEDURE:   Procedure(s): LAPAROSCOPIC CHOLECYSTECTOMY WITH INTRAOPERATIVE CHOLANGIOGRAM  SURGEON:   Ovidio Kin, M.D.  ASSISTANT:   A. Maisie Fus, M.D.  ANESTHESIA:   general  Anesthesiologist: Josepha Pigg, MD CRNA: Rogelia Boga, CRNA  General  EBL:  minimal  ml  BLOOD ADMINISTERED: none  DRAINS: none   LOCAL MEDICATIONS USED:   27 cc 1/4% marcaine  SPECIMEN:   Gall bladder  COUNTS CORRECT:  YES  INDICATIONS FOR PROCEDURE:  [name] is a 47 y.o. (DOB: [birth year]) AA  female whose primary care physician is MARSHALL,BERNARD A, MD and comes for cholecystectomy.  She presented to the [facility] ER with chest/abdominal pain.  She was initially seen by Dr. Fredonia Highland.   The indications and risks of the gall bladder surgery were explained to the patient.  The risks include, but are not limited to, infection, bleeding, common bile duct injury and open surgery.  SURGERY:  The patient was taken to room #1 at [facility].  The abdomen was prepped with chloroprep.  The patient was given 500 mg Cipro at the beginning of the operation.   A time out was held and the surgical checklist run.   An infraumbilical incision was made into the abdominal cavity.  A 12 mm Hasson trocar was inserted into the abdominal cavity through the infraumbilical incision and secured with a 0 Vicryl suture.  Three additional trocars were inserted: a 10 mm trocar in the sub-xiphoid location, a 5 mm trocar in the right mid subcostal area, and a 5 mm trocar in the right lateral subcostal area.   The abdomen was explored and the liver, stomach, and bowel that could be seen were unremarkable.  The duodenum was stuck to the gall bladder and was separated with sharp dissection.   The gall bladder was identified, grasped,  and rotated cephalad.  Disssection was carried down to the gall bladder/cystic duct junction and the cystic duct isolated.  She had large stones in her gall bladder.  There was edema of the gall bladder near the neck of the gall bladder.  A clip was placed on the gall bladder side of the cystic duct.   An intra-operative cholangiogram was shot.   The intra-operative cholangiogram was shot using a cut off Taut catheter placed through a 14 gauge angiocath in the RUQ.  The Taut catheter was inserted in the cut cystic duct and secured with an endoclip.  A cholangiogram was shot with 10 cc of 1/2 strength Omnipaque.  Using fluoroscopy, the cholangiogram showed the flow of contrast into the common bile duct, up the hepatic radicals, and into the duodenum.  There was no mass or obstruction.  This was a normal intra-operative cholangiogram.   The Taut catheter was removed.  The cystic duct was tripley endoclipped and the cystic artery was identified and clipped.  The gall bladder was bluntly and sharpley dissected from the gall bladder bed.   After the gall bladder was removed from the liver, the gall bladder bed and Triangle of [place] were inspected.  There was no bleeding or bile leak.  The gall bladder was placed in a endocatch bag and delivered through the umbilicus.  The abdomen was irrigated with 1000 cc saline.   The trocars were then removed.  I infiltrated 27cc of 1/4% Marcaine into  the incisions.  The umbilical port closed with a 0 Vicryl suture and the skin closed with 5-0 vicryl.  The skin was painted with Dermabond.  The patient's sponge and needle count were correct.  The patient was transported to the RR in good condition.  Ovidio Kin, MD, [facility] Pager: [phone] Office phone:  [phone]

## 2012-05-11 NOTE — Preoperative (Signed)
Beta Blockers   Reason not to administer Beta Blockers:Not Applicable 

## 2012-05-11 NOTE — Anesthesia Preprocedure Evaluation (Addendum)
Anesthesia Evaluation  Patient identified by MRN, date of birth, ID band Patient awake    Reviewed: Allergy & Precautions, H&P , NPO status , Patient's Chart, lab work & pertinent test results  Airway Mallampati: II TM Distance: >3 FB Neck ROM: Full    Dental  (+) Teeth Intact and Partial Upper   Pulmonary neg pulmonary ROS,  breath sounds clear to auscultation        Cardiovascular hypertension, Pt. on medications Rhythm:Regular Rate:Normal     Neuro/Psych Anxiety Depression  Neuromuscular disease    GI/Hepatic negative GI ROS, Neg liver ROS,   Endo/Other  negative endocrine ROS  Renal/GU negative Renal ROS     Musculoskeletal   Abdominal   Peds  Hematology negative hematology ROS (+)   Anesthesia Other Findings   Reproductive/Obstetrics                          Anesthesia Physical Anesthesia Plan  ASA: II  Anesthesia Plan: General   Post-op Pain Management:    Induction: Intravenous  Airway Management Planned: Oral ETT  Additional Equipment:   Intra-op Plan:   Post-operative Plan: Extubation in OR  Informed Consent: I have reviewed the patients History and Physical, chart, labs and discussed the procedure including the risks, benefits and alternatives for the proposed anesthesia with the patient or authorized representative who has indicated his/her understanding and acceptance.   Dental advisory given  Plan Discussed with: CRNA, Surgeon and Anesthesiologist  Anesthesia Plan Comments:        Anesthesia Quick Evaluation

## 2012-05-11 NOTE — ED Provider Notes (Addendum)
History     CSN: [id number]  Arrival date & time 05/11/12  0158   First MD Initiated Contact with Patient 05/11/12 0205      No chief complaint on file.   (Consider location/radiation/quality/duration/timing/severity/associated sxs/prior treatment) Patient is a 47 y.o. female presenting with abdominal pain. The history is provided by the patient.  Abdominal Pain Pain location:  Epigastric Pain quality: gnawing, sharp and shooting   Pain quality: no pressure   Pain radiates to:  Epigastric region Pain severity:  Severe Onset quality:  Sudden Duration:  2 hours Timing:  Constant Progression:  Unchanged Chronicity:  New Context: not alcohol use, not medication withdrawal, not sick contacts and not suspicious food intake   Relieved by:  Nothing Worsened by:  Nothing tried Ineffective treatments:  None tried Associated symptoms: anorexia, nausea and vomiting   Associated symptoms: no cough, no diarrhea, no dysuria, no fever and no shortness of breath   Associated symptoms comment:  Pt states she has chest pain but points directly to her epigastric area Risk factors: no alcohol abuse, no aspirin use and no NSAID use     Past Medical History  Diagnosis Date  . Depression   . Anxiety   . Sciatic nerve injury     No past surgical history on file.  No family history on file.  History  Substance Use Topics  . Smoking status: Never Smoker   . Smokeless tobacco: Not on file  . Alcohol Use: No    OB History   Grav Para Term Preterm Abortions TAB SAB Ect Mult Living                  Review of Systems  Constitutional: Negative for fever.  Respiratory: Negative for cough and shortness of breath.   Gastrointestinal: Positive for nausea, vomiting, abdominal pain and anorexia. Negative for diarrhea.  Genitourinary: Negative for dysuria.  All other systems reviewed and are negative.    Allergies  Morphine and related and Promethazine hcl  Home Medications   Current  Outpatient Rx  Name  Route  Sig  Dispense  Refill  . clonazePAM (KLONOPIN) 0.5 MG tablet   Oral   Take 0.5 mg by mouth at bedtime as needed. For anxiety.         . diazepam (VALIUM) 10 MG tablet   Oral   Take 10 mg by mouth 2 (two) times daily.         Marland Kitchen lidocaine (LIDODERM) 5 %   Transdermal   Place 3 patches onto the skin daily. Remove & Discard patch within 12 hours or as directed by MD         . naproxen (NAPROSYN) 500 MG tablet   Oral   Take 500 mg by mouth 2 (two) times daily with a meal.         . naproxen sodium (ANAPROX) 220 MG tablet   Oral   Take 220 mg by mouth daily as needed. For pain         . ondansetron (ZOFRAN) 8 MG tablet   Oral   Take 8 mg by mouth every 8 (eight) hours as needed. For nausea         . oxyCODONE (OXYCONTIN) 20 MG 12 hr tablet   Oral   Take 20-40 mg by mouth 2 (two) times daily. 40 mg every morning and 20 mg every evening         . oxyCODONE-acetaminophen (PERCOCET) 10-325 MG per tablet  Oral   Take 1 tablet by mouth every 4 (four) hours as needed. For pain.             There were no vitals taken for this visit.  Physical Exam  Nursing note and vitals reviewed. Constitutional: She is oriented to person, place, and time. She appears well-developed and well-nourished. She appears distressed.  HENT:  Head: Normocephalic and atraumatic.  Mouth/Throat: Oropharynx is clear and moist.  Eyes: Conjunctivae and EOM are normal. Pupils are equal, round, and reactive to light.  Neck: Normal range of motion. Neck supple.  Cardiovascular: Normal rate, regular rhythm and intact distal pulses.   No murmur heard. Pulmonary/Chest: Effort normal and breath sounds normal. No respiratory distress. She has no wheezes. She has no rales. She exhibits no tenderness.  Abdominal: Soft. Normal appearance. She exhibits no distension. There is tenderness in the right upper quadrant, epigastric area and left upper quadrant. There is no rebound, no  guarding and no CVA tenderness.  Musculoskeletal: Normal range of motion. She exhibits no edema and no tenderness.  Neurological: She is alert and oriented to person, place, and time.  Skin: Skin is warm and dry. No rash noted. No erythema.  Psychiatric: She has a normal mood and affect. Her behavior is normal.    ED Course  Procedures (including critical care time)  Labs Reviewed  CBC WITH DIFFERENTIAL - Abnormal; Notable for the following:    MCH 25.8 (*)    Basophils Relative 2 (*)    Basophils Absolute 0.2 (*)    All other components within normal limits  COMPREHENSIVE METABOLIC PANEL - Abnormal; Notable for the following:    Potassium 3.3 (*)    Glucose, Bld 119 (*)    Total Bilirubin 0.1 (*)    All other components within normal limits  URINALYSIS, ROUTINE W REFLEX MICROSCOPIC - Abnormal; Notable for the following:    APPearance HAZY (*)    Leukocytes, UA MODERATE (*)    All other components within normal limits  URINE MICROSCOPIC-ADD ON - Abnormal; Notable for the following:    Squamous Epithelial / LPF FEW (*)    All other components within normal limits  LIPASE, BLOOD  D-DIMER, QUANTITATIVE  POCT I-STAT TROPONIN I   Dg Chest 2 View  05/11/2012  *RADIOLOGY REPORT*  Clinical Data: Chest pain and shortness of breath.  CHEST - 2 VIEW  Comparison: None.  Findings: The lungs are well-aerated.  Mild peribronchial thickening is noted.  There is suggestion of mild lower lobe opacity on the lateral view.  There is no evidence of pleural effusion or pneumothorax.  The heart is normal in size; the mediastinal contour is within normal limits.  No acute osseous abnormalities are seen.  IMPRESSION: Suggestion of mild lower lobe opacity on the lateral view, which could reflect mild pneumonia.  Mild peribronchial thickening seen.   Original Report Authenticated By: Tonia Ghent, M.D.    US Abdomen Complete  05/11/2012  *RADIOLOGY REPORT*  Clinical Data:  Right upper quadrant abdominal  pain, radiating to the back and chest.  ABDOMINAL ULTRASOUND COMPLETE  Comparison:  CT of the abdomen and pelvis performed 07/02/2011  Findings:  Gallbladder:  Gallstones are seen filling the gallbladder, measuring up to 2.0 cm in size.  There is a partial wall echo shadow sign.  Associated gallbladder wall thickening is noted, to 0.4 cm.  A positive ultrasonographic Murphy's sign is elicited.  No definite pericholecystic fluid is appreciated.  Common Bile Duct:  0.4  cm in diameter; within normal limits in caliber.  Liver:  Diffusely increased parenchymal echogenicity and coarsened echotexture, compatible with fatty infiltration; no focal lesions identified.  Limited Doppler evaluation demonstrates normal blood flow within the liver.  IVC:  Unremarkable in appearance.  Pancreas:  Although the pancreas is difficult to visualize due to overlying bowel gas, no focal pancreatic abnormality is identified.  Spleen:  8.7 cm in length; within normal limits in size and echotexture.  Right kidney:  10.3 cm in length; normal in size, configuration and parenchymal echogenicity.  No evidence of mass or hydronephrosis.  Left kidney:  9.8 cm in length; normal in size, configuration and parenchymal echogenicity.  No evidence of mass or hydronephrosis.  Abdominal Aorta:  Normal in caliber; no aneurysm identified.  IMPRESSION:  1.  Prominent stones noted filling the gallbladder, with associated gallbladder wall thickening to 0.4 cm, and a positive ultrasonographic Murphy's sign.  This raises question for mild cholecystitis.  No definite pericholecystic fluid seen. 2.  Diffuse fatty infiltration within the liver.   Original Report Authenticated By: Tonia Ghent, M.D.      Date: 05/11/2012  Rate: 89  Rhythm: normal sinus rhythm  QRS Axis: normal  Intervals: normal  ST/T Wave abnormalities: normal  Conduction Disutrbances: none  Narrative Interpretation: unremarkable     1. Cholecystitis       MDM   Patient  presents in distress rolling around and screaming that her chest hurts. However on exam she is actually pointing to her epigastric area. There up from sleep approximately 2 hours ago and she has vomited multiple times. She states that she takes Percocet regularly and has not run out. She denies heavy NSAID use.  On exam she is tender in the right upper quadrant, epigastrium and left upper quadrants.  She denies alcohol use or any drug use. She has no prior cardiac history.  Patient is hypertensive but otherwise vital signs are stable. Concern for abdominal pathology versus chest pathology. EKG and chest x-ray pending. CBC, CMP, lipase pending. Patient given pain control and will reassess to see if she has localized right upper quadrant pain that was require an ultrasound. Low suspicion for aortic dissection and abdominal exam does not suggest perforated viscus.  CBC, CMP, lipase, UA, chest x-ray, EKG pending.  Pt given dilaudid and zofran. 3:46 AM Labs wnl except for possible contaminated UA but pt denies dysuria/frequency or urgency.  EKG wnl.  CXR suggest possible mild PNA however pt denies cough or SOB.  States that before 3 hours ago she was feeling fine.  On exam now pain is mostly in the epigstrium and less RUQ tenderness however on CT back in April showed pt had multiple gallstones.  Will get U/S to further eval.  Normal lipase. Will give another dose of dilaudid and give GI cocktail to see that improves pain.   5:10 AM Pt still in pain.  U/S shows cholecystitis.  Gen surgery consulted.   Gwyneth Sprout, MD 05/11/12 1610  Gwyneth Sprout, MD 05/11/12 9604

## 2012-05-11 NOTE — Anesthesia Procedure Notes (Signed)
Procedure Name: Intubation Date/Time: 05/11/2012 9:23 AM Performed by: Rogelia Boga Pre-anesthesia Checklist: Patient identified, Emergency Drugs available, Suction available, Patient being monitored and Timeout performed Patient Re-evaluated:Patient Re-evaluated prior to inductionOxygen Delivery Method: Circle system utilized Preoxygenation: Pre-oxygenation with 100% oxygen Intubation Type: IV induction Ventilation: Mask ventilation without difficulty and Oral airway inserted - appropriate to patient size Laryngoscope Size: Mac and 4 Grade View: Grade I Tube type: Oral Tube size: 7.5 mm Number of attempts: 1 Airway Equipment and Method: Stylet Placement Confirmation: ETT inserted through vocal cords under direct vision,  positive ETCO2 and breath sounds checked- equal and bilateral Secured at: 21 cm Tube secured with: Tape Dental Injury: Teeth and Oropharynx as per pre-operative assessment
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
## 2012-05-12 ENCOUNTER — Telehealth (INDEPENDENT_AMBULATORY_CARE_PROVIDER_SITE_OTHER): Payer: Self-pay | Admitting: General Surgery

## 2012-05-12 MED ORDER — OXYCODONE HCL 10 MG PO TABS: 5.0000 mg | ORAL_TABLET | ORAL | Status: DC | PRN

## 2012-05-12 MED ORDER — OXYCODONE-ACETAMINOPHEN 5-325 MG PO TABS
1.0000 | ORAL_TABLET | Freq: Four times a day (QID) | ORAL | Status: DC
  Administered 2012-05-12: 1 via ORAL
  Filled 2012-05-12: qty 1

## 2012-05-12 MED ORDER — OXYCODONE HCL 5 MG PO TABS
10.0000 mg | ORAL_TABLET | Freq: Four times a day (QID) | ORAL | Status: DC
  Administered 2012-05-12: 10 mg via ORAL
  Filled 2012-05-12 (×2): qty 1

## 2012-05-12 NOTE — Progress Notes (Signed)
Consulted with pharmacy regarding the patients scheduled Percocet, scheduled IV tylenol 1000 mg, as well as PRN Vicodin. Pharmacy recommended holding IV tylenol so pt does not exceed 4000 mg of tylenol in a 24 hour period.

## 2012-05-12 NOTE — Discharge Summary (Signed)
Patient ID: Kim Skinner MRN: [id number] DOB/AGE: 1965/10/25 47 y.o.  Admit date: 05/11/2012 Discharge date: 05/12/2012  Procedures: laparoscopic cholecystectomy with IOC - D. Somalia Segler - 05/11/2012  Consults: None  Reason for Admission: 47 year old African-American female who awoke in the night with severe onset of epigastric and chest pain Was brought to the emergency room for evaluation. She denies any prior symptoms. She was ruled out for myocardial infarction. Her workup revealed numerous gallstones in the gallbladder along with wall thickening. We were asked to consult.  Admission Diagnoses:  Symptomatic cholelithiasis probable early acute cholecystitis  Depression  H/o sciatic nerve injury  Elevated BP  Hospital Course: The patient was admitted and taken to the operating room where she underwent a lap chole with IOC.  The patient tolerated the procedure well.  She does have chronic pain and her pain medications did have to be adjusted slightly.  She was tolerating a regular diet and her pain was controlled with oral pain meds on POD# 1.  She was stable for dc home.  PE: Abd: soft, appropriately tender, greatest at umbilicus, +BS, ND, incisions c/d/i  Discharge Diagnoses:  1. Acute cholecystitis, s/p lap chole 2. Chronic pain 3. Elevated BP  Discharge Medications:   Medication List    TAKE these medications       amLODipine 10 MG tablet  Commonly known as:  NORVASC  Take 10 mg by mouth daily.     DULoxetine 60 MG capsule  Commonly known as:  CYMBALTA  Take 60 mg by mouth daily.     gabapentin 800 MG tablet  Commonly known as:  NEURONTIN  Take 800 mg by mouth 3 (three) times daily.     lisinopril-hydrochlorothiazide 20-12.5 MG per tablet  Commonly known as:  PRINZIDE,ZESTORETIC  Take 1 tablet by mouth daily.     Oxycodone HCl 10 MG Tabs  Take 0.5-1 tablets (5-10 mg total) by mouth every 4 (four) hours as needed for pain.     oxyCODONE-acetaminophen 10-325 MG  per tablet  Commonly known as:  PERCOCET  Take 1 tablet by mouth every 4 (four) hours as needed. For pain.           Discharge Instructions:     Follow-up Information   Follow up with [facility] On 05/29/2012. (12:15pm, arrive at 11:45am)    Contact information:   [address]   [place] [address] [phone]       Signed: Letha Cape 05/12/2012, 9:07 AM  Agree with above. Looks good. Pain is an issue, not surprisingly.  Ovidio Kin, MD, [facility] Surgery Pager: [phone] Office phone:  [phone]

## 2012-05-12 NOTE — Telephone Encounter (Signed)
Pharmacist called and stated that she was at pharmacy to fill percocet 10 Rx.  She had just had a 90 tablet Rx filled 2 weeks ago.  Pharmacist didn't feel comfortable giving her more.  Instructed the patient to continue her previous percocet script for now.

## 2012-05-13 ENCOUNTER — Telehealth (INDEPENDENT_AMBULATORY_CARE_PROVIDER_SITE_OTHER): Payer: Self-pay | Admitting: General Surgery

## 2012-05-13 NOTE — Telephone Encounter (Signed)
I called the patient back about her concern over pain medicine.  She has a pain contract.  She was written a Rx for oxycodone 5-10mg  to cover in case she were to run out of her percocet 10s.  She stated she didn't have enough of these to probably last her.  We got a call from the pharmacy stating that she just received 90 10s on 04-27-12.  She is dosed to take 1 TID.  I informed the pharmacy to shred her new Rx i wrote as she should have enough of her home medicine to cover her immediate post op pain; however, she will run out of her normal monthly Rx early.  I have contacted to the patient to inform her of this and she will need to call her pain management doctor and inform him of the situation so he can further manage her medication since she has a pain contract.  The patient stated she understood.  Kim Skinner E

## 2012-05-16 ENCOUNTER — Encounter (HOSPITAL_COMMUNITY): Payer: Self-pay | Admitting: Surgery

## 2012-05-29 ENCOUNTER — Ambulatory Visit (INDEPENDENT_AMBULATORY_CARE_PROVIDER_SITE_OTHER): Payer: Medicaid Other | Admitting: General Surgery

## 2012-05-29 ENCOUNTER — Encounter (INDEPENDENT_AMBULATORY_CARE_PROVIDER_SITE_OTHER): Payer: Self-pay | Admitting: General Surgery

## 2012-05-29 ENCOUNTER — Encounter (INDEPENDENT_AMBULATORY_CARE_PROVIDER_SITE_OTHER): Payer: Self-pay

## 2012-05-29 VITALS — BP 142/96 | HR 84 | Temp 98.5°F | Resp 16 | Ht 67.0 in | Wt 199.2 lb

## 2012-05-29 DIAGNOSIS — K8010 Calculus of gallbladder with chronic cholecystitis without obstruction: Secondary | ICD-10-CM

## 2012-05-29 DIAGNOSIS — Z9889 Other specified postprocedural states: Secondary | ICD-10-CM

## 2012-05-29 DIAGNOSIS — Z9049 Acquired absence of other specified parts of digestive tract: Secondary | ICD-10-CM

## 2012-05-29 NOTE — Patient Instructions (Addendum)
She may resume a regular diet and full activity.  She may follow-up on a PRN basis. No lifting >40lbs for 6 weeks, no swimming or hot tub for 6 weeks post op.

## 2012-05-29 NOTE — Progress Notes (Signed)
  Subjective: Kim Skinner is a 47 y.o. female who had a laparoscopic cholecystectomy  on 05/11/12 by Dr. Ezzard Standing  returns to the clinic today.  Pathology reveals Chronic active cholecystitis and cholelithiasis, no tumor noted.  The patient is tolerating their diet well and is having no severe pain.  Bowel function is good.  The pre-operative symptoms of abdominal pain, nausea, and vomiting have resolved.  No problems with the wounds.  Pt is returning to normal activity.   Objective: Vital signs in last 24 hours: Reviewed   PE: General:  Alert, NAD, pleasant Abdomen:  soft, NT/ND, +bs, incisions appear well-healed with no sign of infection or bleeding   Assessment/Plan  1.  S/P Laparoscopic Cholecystectomy with IOC: doing well, may resume regular activity without restrictions, Pt will follow up with [place] PRN and knows to call with questions or concerns.  No swimming/hot tub for 6 weeks, can do physical therapy that is "dry land" but no water therapy.  No lifting >40lbs until 6 weeks post op.    Aris Georgia, PA-C 05/29/2012

## 2012-09-11 ENCOUNTER — Ambulatory Visit (INDEPENDENT_AMBULATORY_CARE_PROVIDER_SITE_OTHER): Payer: Medicaid Other | Admitting: Neurology

## 2012-09-11 ENCOUNTER — Encounter: Payer: Self-pay | Admitting: Neurology

## 2012-09-11 VITALS — BP 131/87 | HR 89 | Ht 68.5 in | Wt 208.0 lb

## 2012-09-11 DIAGNOSIS — M545 Low back pain, unspecified: Secondary | ICD-10-CM
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
## 2012-09-20 ENCOUNTER — Inpatient Hospital Stay: Admission: RE | Admit: 2012-09-20 | Payer: Medicaid Other | Source: Ambulatory Visit

## 2012-10-04 ENCOUNTER — Encounter: Payer: Medicaid Other | Admitting: Neurology

## 2012-10-23 ENCOUNTER — Other Ambulatory Visit: Payer: Self-pay | Admitting: Internal Medicine

## 2012-10-23 DIAGNOSIS — Z1231 Encounter for screening mammogram for malignant neoplasm of breast: Secondary | ICD-10-CM

## 2012-11-08 ENCOUNTER — Ambulatory Visit
Admission: RE | Admit: 2012-11-08 | Discharge: 2012-11-08 | Disposition: A | Discharge status: Home / Self Care | Payer: Medicaid Other | Source: Ambulatory Visit | Attending: Internal Medicine | Admitting: Internal Medicine

## 2012-11-08 DIAGNOSIS — Z1231 Encounter for screening mammogram for malignant neoplasm of breast: Secondary | ICD-10-CM

## 2013-01-11 ENCOUNTER — Other Ambulatory Visit (HOSPITAL_COMMUNITY): Payer: Self-pay | Admitting: Obstetrics

## 2013-01-11 DIAGNOSIS — N800 Endometriosis of uterus: Secondary | ICD-10-CM

## 2013-01-16 ENCOUNTER — Ambulatory Visit (HOSPITAL_COMMUNITY): Payer: Medicaid Other

## 2013-01-16 ENCOUNTER — Other Ambulatory Visit (HOSPITAL_COMMUNITY): Payer: Self-pay | Admitting: Obstetrics

## 2013-01-16 DIAGNOSIS — R935 Abnormal findings on diagnostic imaging of other abdominal regions, including retroperitoneum: Secondary | ICD-10-CM

## 2013-01-24 ENCOUNTER — Ambulatory Visit (HOSPITAL_COMMUNITY): Admission: RE | Admit: 2013-01-24 | Payer: Medicaid Other | Source: Ambulatory Visit

## 2013-01-31 ENCOUNTER — Ambulatory Visit (HOSPITAL_COMMUNITY)
Admission: RE | Admit: 2013-01-31 | Discharge: 2013-01-31 | Disposition: A | Discharge status: Home / Self Care | Payer: Medicaid Other | Source: Ambulatory Visit | Attending: Obstetrics | Admitting: Obstetrics

## 2013-07-09 ENCOUNTER — Emergency Department (HOSPITAL_COMMUNITY)
Admission: EM | Admit: 2013-07-09 | Discharge: 2013-07-09 | Disposition: A | Discharge status: Home / Self Care | Payer: Medicaid Other | Attending: Emergency Medicine | Admitting: Emergency Medicine

## 2013-07-09 ENCOUNTER — Encounter (HOSPITAL_COMMUNITY): Payer: Self-pay | Admitting: Emergency Medicine

## 2013-07-09 DIAGNOSIS — Z87828 Personal history of other (healed) physical injury and trauma: Secondary | ICD-10-CM | POA: Insufficient documentation

## 2013-07-09 DIAGNOSIS — M549 Dorsalgia, unspecified: Secondary | ICD-10-CM | POA: Insufficient documentation

## 2013-07-09 DIAGNOSIS — F3289 Other specified depressive episodes: Secondary | ICD-10-CM | POA: Insufficient documentation

## 2013-07-09 DIAGNOSIS — M25559 Pain in unspecified hip: Secondary | ICD-10-CM | POA: Insufficient documentation

## 2013-07-09 DIAGNOSIS — F329 Major depressive disorder, single episode, unspecified: Secondary | ICD-10-CM | POA: Insufficient documentation

## 2013-07-09 DIAGNOSIS — Z79899 Other long term (current) drug therapy: Secondary | ICD-10-CM | POA: Insufficient documentation

## 2013-07-09 DIAGNOSIS — G8929 Other chronic pain: Secondary | ICD-10-CM | POA: Insufficient documentation

## 2013-07-09 DIAGNOSIS — I10 Essential (primary) hypertension: Secondary | ICD-10-CM | POA: Insufficient documentation

## 2013-07-09 DIAGNOSIS — F411 Generalized anxiety disorder: Secondary | ICD-10-CM | POA: Insufficient documentation

## 2013-07-09 DIAGNOSIS — M79609 Pain in unspecified limb: Secondary | ICD-10-CM | POA: Insufficient documentation

## 2013-07-09 DIAGNOSIS — G8921 Chronic pain due to trauma: Secondary | ICD-10-CM | POA: Insufficient documentation

## 2013-07-09 DIAGNOSIS — A5901 Trichomonal vulvovaginitis: Secondary | ICD-10-CM

## 2013-07-09 DIAGNOSIS — Z8742 Personal history of other diseases of the female genital tract: Secondary | ICD-10-CM | POA: Insufficient documentation

## 2013-07-09 HISTORY — DX: Endometriosis, unspecified: N80.9

## 2013-07-09 LAB — URINE MICROSCOPIC-ADD ON

## 2013-07-09 LAB — URINALYSIS, ROUTINE W REFLEX MICROSCOPIC
Glucose, UA: NEGATIVE mg/dL
Ketones, ur: NEGATIVE mg/dL
Nitrite: NEGATIVE
Protein, ur: NEGATIVE mg/dL
Specific Gravity, Urine: 1.027 (ref 1.005–1.030)
Urobilinogen, UA: 1 mg/dL (ref 0.0–1.0)
pH: 6.5 (ref 5.0–8.0)

## 2013-07-09 MED ORDER — PREDNISONE 10 MG PO TABS: 20.0000 mg | ORAL_TABLET | Freq: Every day | ORAL | Status: DC

## 2013-07-09 MED ORDER — DIAZEPAM 5 MG/ML IJ SOLN
10.0000 mg | Freq: Once | INTRAMUSCULAR | Status: AC
  Administered 2013-07-09: 10 mg via INTRAVENOUS
  Filled 2013-07-09: qty 2

## 2013-07-09 MED ORDER — DEXAMETHASONE SODIUM PHOSPHATE 10 MG/ML IJ SOLN
10.0000 mg | Freq: Once | INTRAMUSCULAR | Status: AC
  Administered 2013-07-09: 10 mg via INTRAVENOUS
  Filled 2013-07-09: qty 1

## 2013-07-09 MED ORDER — KETOROLAC TROMETHAMINE 30 MG/ML IJ SOLN
30.0000 mg | Freq: Once | INTRAMUSCULAR | Status: AC
  Administered 2013-07-09: 30 mg via INTRAVENOUS
  Filled 2013-07-09: qty 1

## 2013-07-09 MED ORDER — DIVALPROEX SODIUM 500 MG PO DR TAB: 500.0000 mg | DELAYED_RELEASE_TABLET | Freq: Two times a day (BID) | ORAL | Status: DC

## 2013-07-09 MED ORDER — DIVALPROEX SODIUM 250 MG PO DR TAB
250.0000 mg | DELAYED_RELEASE_TABLET | Freq: Once | ORAL | Status: AC
  Administered 2013-07-09: 250 mg via ORAL
  Filled 2013-07-09 (×2): qty 1

## 2013-07-09 MED ORDER — FENTANYL CITRATE 0.05 MG/ML IJ SOLN: 50.0000 ug | INTRAMUSCULAR | Status: DC | PRN

## 2013-07-09 MED ORDER — FENTANYL CITRATE 0.05 MG/ML IJ SOLN
100.0000 ug | INTRAMUSCULAR | Status: DC | PRN
  Administered 2013-07-09: 100 ug via INTRAVENOUS
  Filled 2013-07-09: qty 2

## 2013-07-09 MED ORDER — FENTANYL CITRATE 0.05 MG/ML IJ SOLN
50.0000 ug | Freq: Once | INTRAMUSCULAR | Status: AC
  Administered 2013-07-09: 50 ug via INTRAVENOUS
  Filled 2013-07-09: qty 2

## 2013-07-09 MED ORDER — METRONIDAZOLE 500 MG PO TABS
2000.0000 mg | ORAL_TABLET | Freq: Once | ORAL | Status: AC
  Administered 2013-07-09: 2000 mg via ORAL
  Filled 2013-07-09: qty 4

## 2013-07-09 NOTE — ED Notes (Signed)
MD at bedside. 
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
## 2013-07-09 NOTE — Discharge Instructions (Signed)
Chronic Pain Chronic pain can be defined as pain that is off and on and lasts for 3 6 months or longer. Many things cause chronic pain, which can make it difficult to make a diagnosis. There are many treatment options available for chronic pain. However, finding a treatment that works well for you may require trying various approaches until the right one is found. Many people benefit from a combination of two or more types of treatment to control their pain. SYMPTOMS  Chronic pain can occur anywhere in the body and can range from mild to very severe. Some types of chronic pain include:  Headache.  Low back pain.  Cancer pain.  Arthritis pain.  Neurogenic pain. This is pain resulting from damage to nerves. People with chronic pain may also have other symptoms such as:  Depression.  Anger.  Insomnia.  Anxiety. DIAGNOSIS  Your health care provider will help diagnose your condition over time. In many cases, the initial focus will be on excluding possible conditions that could be causing the pain. Depending on your symptoms, your health care provider may order tests to diagnose your condition. Some of these tests may include:   Blood tests.   CT scan.   MRI.   X-rays.   Ultrasounds.   Nerve conduction studies.  You may need to see a specialist.  TREATMENT  Finding treatment that works well may take time. You may be referred to a pain specialist. He or she may prescribe medicine or therapies, such as:   Mindful meditation or yoga.  Shots (injections) of numbing or pain-relieving medicines into the spine or area of pain.  Local electrical stimulation.  Acupuncture.   Massage therapy.   Aroma, color, light, or sound therapy.   Biofeedback.   Working with a physical therapist to keep from getting stiff.   Regular, gentle exercise.   Cognitive or behavioral therapy.   Group support.  Sometimes, surgery may be recommended.  HOME CARE INSTRUCTIONS    Take all medicines as directed by your health care provider.   Lessen stress in your life by relaxing and doing things such as listening to calming music.   Exercise or be active as directed by your health care provider.   Eat a healthy diet and include things such as vegetables, fruits, fish, and lean meats in your diet.   Keep all follow-up appointments with your health care provider.   Attend a support group with others suffering from chronic pain. SEEK MEDICAL CARE IF:   Your pain gets worse.   You develop a new pain that was not there before.   You cannot tolerate medicines given to you by your health care provider.   You have new symptoms since your last visit with your health care provider.  SEEK IMMEDIATE MEDICAL CARE IF:   You feel weak.   You have decreased sensation or numbness.   You lose control of bowel or bladder function.   Your pain suddenly gets much worse.   You develop shaking.  You develop chills.  You develop confusion.  You develop chest pain.  You develop shortness of breath.  MAKE SURE YOU:  Understand these instructions.  Will watch your condition.  Will get help right away if you are not doing well or get worse. Document Released: 12/04/2001 Document Revised: 11/14/2012 Document Reviewed: 09/07/2012 [facility] Patient Information 2014 [place].  Trichomoniasis Trichomoniasis is an infection, caused by the Trichomonas organism, that affects both women and men. In women, the  outer female genitalia and the vagina are affected. In men, the penis is mainly affected, but the prostate and other reproductive organs can also be involved. Trichomoniasis is a sexually transmitted disease (STD) and is most often passed to another person through sexual contact. The majority of people who get trichomoniasis do so from a sexual encounter and are also at risk for other STDs. CAUSES   Sexual intercourse with an infected  partner.  It can be present in swimming pools or hot tubs. SYMPTOMS   Abnormal gray-green frothy vaginal discharge in women.  Vaginal itching and irritation in women.  Itching and irritation of the area outside the vagina in women.  Penile discharge with or without pain in males.  Inflammation of the urethra (urethritis), causing painful urination.  Bleeding after sexual intercourse. RELATED COMPLICATIONS  Pelvic inflammatory disease.  Infection of the uterus (endometritis).  Infertility.  Tubal (ectopic) pregnancy.  It can be associated with other STDs, including gonorrhea and chlamydia, hepatitis B, and HIV. COMPLICATIONS DURING PREGNANCY  Early (premature) delivery.  Premature rupture of the membranes (PROM).  Low birth weight. DIAGNOSIS   Visualization of Trichomonas under the microscope from the vagina discharge.  Ph of the vagina greater than 4.5, tested with a test tape.  Trich Rapid Test.  Culture of the organism, but this is not usually needed.  It may be found on a Pap test.  Having a "strawberry cervix,"which means the cervix looks very red like a strawberry. TREATMENT   You may be given medication to fight the infection. Inform your caregiver if you could be or are pregnant. Some medications used to treat the infection should not be taken during pregnancy.  Over-the-counter medications or creams to decrease itching or irritation may be recommended.  Your sexual partner will need to be treated if infected. HOME CARE INSTRUCTIONS   Take all medication prescribed by your caregiver.  Take over-the-counter medication for itching or irritation as directed by your caregiver.  Do not have sexual intercourse while you have the infection.  Do not douche or wear tampons.  Discuss your infection with your partner, as your partner may have acquired the infection from you. Or, your partner may have been the person who transmitted the infection to  you.  Have your sex partner examined and treated if necessary.  Practice safe, informed, and protected sex.  See your caregiver for other STD testing. SEEK MEDICAL CARE IF:   You still have symptoms after you finish the medication.  You have an oral temperature above 102 F (38.9 C).  You develop belly (abdominal) pain.  You have pain when you urinate.  You have bleeding after sexual intercourse.  You develop a rash.  The medication makes you sick or makes you throw up (vomit). Document Released: 09/07/2000 Document Revised: 06/06/2011 Document Reviewed: 10/03/2008 [facility] Patient Information 2014 Kim Skinner, [place].

## 2013-07-09 NOTE — ED Notes (Signed)
Bed: PF29 Expected date:  Expected time:  Means of arrival:  Comments: Back pain, fentanyl on board

## 2013-09-25 ENCOUNTER — Other Ambulatory Visit (HOSPITAL_COMMUNITY): Payer: Self-pay | Admitting: Neurosurgery

## 2013-09-25 DIAGNOSIS — M545 Low back pain: Secondary | ICD-10-CM

## 2013-10-07 ENCOUNTER — Encounter (HOSPITAL_COMMUNITY): Payer: Self-pay | Admitting: Pharmacy Technician

## 2013-10-10 ENCOUNTER — Other Ambulatory Visit (HOSPITAL_COMMUNITY): Payer: Self-pay | Admitting: Neurosurgery

## 2013-10-10 ENCOUNTER — Inpatient Hospital Stay (HOSPITAL_COMMUNITY): Admission: RE | Admit: 2013-10-10 | Payer: Medicaid Other | Source: Ambulatory Visit

## 2013-10-10 ENCOUNTER — Ambulatory Visit (HOSPITAL_COMMUNITY)
Admission: RE | Admit: 2013-10-10 | Discharge: 2013-10-10 | Disposition: A | Discharge status: Home / Self Care | Payer: Medicaid Other | Source: Ambulatory Visit | Attending: Neurosurgery | Admitting: Neurosurgery

## 2013-10-10 DIAGNOSIS — M545 Low back pain: Secondary | ICD-10-CM

## 2013-10-28 ENCOUNTER — Ambulatory Visit (HOSPITAL_COMMUNITY)
Admission: RE | Admit: 2013-10-28 | Discharge: 2013-10-28 | Disposition: A | Discharge status: Home / Self Care | Payer: Medicaid Other | Source: Ambulatory Visit | Attending: Neurosurgery | Admitting: Neurosurgery

## 2013-10-28 DIAGNOSIS — M545 Low back pain, unspecified: Secondary | ICD-10-CM | POA: Diagnosis present

## 2013-10-28 DIAGNOSIS — M5126 Other intervertebral disc displacement, lumbar region: Secondary | ICD-10-CM | POA: Diagnosis not present

## 2013-10-28 MED ORDER — DIAZEPAM 5 MG PO TABS
10.0000 mg | ORAL_TABLET | Freq: Once | ORAL | Status: AC
  Administered 2013-10-28: 10 mg via ORAL
  Filled 2013-10-28: qty 2

## 2013-10-28 MED ORDER — OXYCODONE HCL 5 MG PO TABS: 5.0000 mg | ORAL_TABLET | ORAL | Status: DC | PRN

## 2013-10-28 MED ORDER — OXYCODONE-ACETAMINOPHEN 5-325 MG PO TABS
2.0000 | ORAL_TABLET | Freq: Once | ORAL | Status: AC
  Administered 2013-10-28: 2 via ORAL

## 2013-10-28 MED ORDER — OXYCODONE-ACETAMINOPHEN 5-325 MG PO TABS
ORAL_TABLET | ORAL | Status: AC
  Administered 2013-10-28: 2 via ORAL
  Filled 2013-10-28: qty 2

## 2013-10-28 MED ORDER — IOHEXOL 180 MG/ML  SOLN
20.0000 mL | Freq: Once | INTRAMUSCULAR | Status: AC | PRN
  Administered 2013-10-28: 16 mL via INTRATHECAL

## 2013-10-28 MED ORDER — ONDANSETRON HCL 4 MG/2ML IJ SOLN: 4.0000 mg | Freq: Four times a day (QID) | INTRAMUSCULAR | Status: DC | PRN

## 2013-10-28 MED ORDER — DIAZEPAM 5 MG PO TABS
ORAL_TABLET | ORAL | Status: AC
  Administered 2013-10-28: 10 mg via ORAL
  Filled 2013-10-28: qty 2

## 2013-10-28 MED ORDER — TRAMADOL HCL 50 MG PO TABS: 50.0000 mg | ORAL_TABLET | Freq: Four times a day (QID) | ORAL | Status: DC | PRN
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
## 2013-10-28 NOTE — H&P (Signed)
BP 118/56  Pulse 92  Temp(Src) 97.8 F (36.6 C) (Oral)  Resp 20  Ht 5\' 8"  (1.727 m)  Wt 99.791 kg (220 lb)  BMI 33.46 kg/m2  SpO2 100%     Kim Skinner returns today.  I have not seen her since December 2013.  At that time, she presented with pain in her lower back and into the lower extremities.  I did an MRI of the lumbar spine and found she had a large adnexal mass and then sent her to her gynecologist.  It seems that nothing came of that.  She had minimal findings at best in the lumbar spine at that time.  These findings would in no way account for swelling of the lower extremities, pain in the hips, pain in the legs, swelling in the feet, pain in the feet.  She has tiny disc at L5-S1 on the left and something which was very small at L4-5.  So, it is somewhat strange to me that she is being sent back for a surgical opinion  since surgical opinion have been rendered.  Nevertheless, I would need to have her undergo a myelogram and postmyelogram CT since she has body jewelry that cannot go through the MRI.     The referring note states that the patient had told them that she had pain especially at the right side causing discomfort pain in the hip, lower extremities, and leg, left greater than right.  Shower chair was written for her.  Pain clinic followup was encouraged and referred to me to see if there is anything needs to be done.     MEDICATIONS AND ALLERGIES:          Medications currently are Elavil, amlodipine, baclofen, diazepam, gabapentin, lisinopril, oxycodone 10 mg and senna.  SHE HAS ALLERGIES TO MORPHINE AND PROMETHAZINE.     PHYSICAL EXAMINATION:                    Weight is 225 pounds, height 5 feet 8 inches.  Temperature is 97.  Blood pressure is 134/89, pulse is 97.  On examination, she is alert and oriented x4 and answers all questions appropriately.  Memory, language, attention span and fund of knowledge are normal.  Pupils are equal, round and reactive to light.  Symmetric  facial sensation and movement.  Hearing intact to finger rub.  Uvula elevates in midline.  Shoulder shrug is normal.  Tongue protrudes in the midline.  5/5 strength in the upper and lower extremities.  Vague reflexes at the knees and ankles bilaterally.  She has an antalgic gait.     There are no new studies.     IMPRESSION/PLAN:                             I will order myelogram and postmyelogram CT for Kim Skinner.  We will see if the situation has changed. And certainly that the situation could have changed from  the last time I saw her, which was 2013.

## 2013-10-28 NOTE — Discharge Instructions (Signed)
Myelogram and Lumbar Puncture Discharge Instructions  1. Go home and rest quietly for the next 24 hours.  It is important to lie flat for the next 24 hours.  Get up only to go to the restroom.  You may lie in the bed or on a couch on your back, your stomach, your left side or your right side.  You may have one pillow under your head.  You may have pillows between your knees while you are on your side or under your knees while you are on your back.  2. DO NOT drive today.  Recline the seat as far back as it will go, while still wearing your seat belt, on the way home.  3. You may get up to go to the bathroom as needed.  You may sit up for 10 minutes to eat.  You may resume your normal diet and medications unless otherwise indicated.  4. The incidence of headache, nausea, or vomiting is about 5% (one in 20 patients).  If you develop a headache, lie flat and drink plenty of fluids until the headache goes away.  Caffeinated beverages may be helpful.  If you develop severe nausea and vomiting or a headache that does not go away with flat bed rest, call MD.  5. You may resume normal activities after your 24 hours of bed rest is over; however, do not exert yourself strongly or do any heavy lifting tomorrow.  6. Call your physician for a follow-up appointment.  The results of your myelogram will be sent directly to your physician by the following day.  7. If you have any questions or if complications develop after you arrive home, please call MD.  Discharge instructions have been explained to the patient.  The patient, or the person responsible for the patient, fully understands these instructions.   

## 2013-11-08 ENCOUNTER — Other Ambulatory Visit: Payer: Self-pay | Admitting: Internal Medicine

## 2013-11-08 DIAGNOSIS — Z1231 Encounter for screening mammogram for malignant neoplasm of breast: Secondary | ICD-10-CM

## 2013-11-20 ENCOUNTER — Ambulatory Visit: Payer: Medicaid Other

## 2014-09-12 ENCOUNTER — Emergency Department (HOSPITAL_COMMUNITY)
Admission: EM | Admit: 2014-09-12 | Discharge: 2014-09-12 | Disposition: A | Discharge status: Home / Self Care | Payer: No Typology Code available for payment source | Attending: Emergency Medicine | Admitting: Emergency Medicine

## 2014-09-12 ENCOUNTER — Encounter (HOSPITAL_COMMUNITY): Payer: Self-pay | Admitting: Emergency Medicine

## 2014-09-12 DIAGNOSIS — S161XXA Strain of muscle, fascia and tendon at neck level, initial encounter: Secondary | ICD-10-CM

## 2014-09-12 DIAGNOSIS — I10 Essential (primary) hypertension: Secondary | ICD-10-CM | POA: Diagnosis not present

## 2014-09-12 DIAGNOSIS — F329 Major depressive disorder, single episode, unspecified: Secondary | ICD-10-CM | POA: Diagnosis not present

## 2014-09-12 DIAGNOSIS — Y92410 Unspecified street and highway as the place of occurrence of the external cause: Secondary | ICD-10-CM | POA: Diagnosis not present

## 2014-09-12 DIAGNOSIS — Z8742 Personal history of other diseases of the female genital tract: Secondary | ICD-10-CM | POA: Diagnosis not present

## 2014-09-12 DIAGNOSIS — S0003XA Contusion of scalp, initial encounter: Secondary | ICD-10-CM | POA: Diagnosis not present

## 2014-09-12 DIAGNOSIS — S29002A Unspecified injury of muscle and tendon of back wall of thorax, initial encounter: Secondary | ICD-10-CM | POA: Diagnosis not present

## 2014-09-12 DIAGNOSIS — Y939 Activity, unspecified: Secondary | ICD-10-CM | POA: Insufficient documentation

## 2014-09-12 DIAGNOSIS — Z79899 Other long term (current) drug therapy: Secondary | ICD-10-CM | POA: Insufficient documentation

## 2014-09-12 DIAGNOSIS — F419 Anxiety disorder, unspecified: Secondary | ICD-10-CM | POA: Insufficient documentation

## 2014-09-12 DIAGNOSIS — S0990XA Unspecified injury of head, initial encounter: Secondary | ICD-10-CM | POA: Diagnosis present

## 2014-09-12 DIAGNOSIS — Y999 Unspecified external cause status: Secondary | ICD-10-CM | POA: Diagnosis not present
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
## 2014-09-12 NOTE — Discharge Instructions (Signed)
Cervical Sprain °A cervical sprain is an injury in the neck in which the strong, fibrous tissues (ligaments) that connect your neck bones stretch or tear. Cervical sprains can range from mild to severe. Severe cervical sprains can cause the neck vertebrae to be unstable. This can lead to damage of the spinal cord and can result in serious nervous system problems. The amount of time it takes for a cervical sprain to get better depends on the cause and extent of the injury. Most cervical sprains heal in 1 to 3 weeks. °CAUSES  °Severe cervical sprains may be caused by:  °· Contact sport injuries (such as from football, rugby, wrestling, hockey, auto racing, gymnastics, diving, martial arts, or boxing).   °· Motor vehicle collisions.   °· Whiplash injuries. This is an injury from a sudden forward and backward whipping movement of the head and neck.  °· Falls.   °Mild cervical sprains may be caused by:  °· Being in an awkward position, such as while cradling a telephone between your ear and shoulder.   °· Sitting in a chair that does not offer proper support.   °· Working at a poorly designed computer station.   °· Looking up or down for long periods of time.   °SYMPTOMS  °· Pain, soreness, stiffness, or a burning sensation in the front, back, or sides of the neck. This discomfort may develop immediately after the injury or slowly, 24 hours or more after the injury.   °· Pain or tenderness directly in the middle of the back of the neck.   °· Shoulder or upper back pain.   °· Limited ability to move the neck.   °· Headache.   °· Dizziness.   °· Weakness, numbness, or tingling in the hands or arms.   °· Muscle spasms.   °· Difficulty swallowing or chewing.   °· Tenderness and swelling of the neck.   °DIAGNOSIS  °Most of the time your health care provider can diagnose a cervical sprain by taking your history and doing a physical exam. Your health care provider will ask about previous neck injuries and any known neck  problems, such as arthritis in the neck. X-rays may be taken to find out if there are any other problems, such as with the bones of the neck. Other tests, such as a CT scan or MRI, may also be needed.  °TREATMENT  °Treatment depends on the severity of the cervical sprain. Mild sprains can be treated with rest, keeping the neck in place (immobilization), and pain medicines. Severe cervical sprains are immediately immobilized. Further treatment is done to help with pain, muscle spasms, and other symptoms and may include: °· Medicines, such as pain relievers, numbing medicines, or muscle relaxants.   °· Physical therapy. This may involve stretching exercises, strengthening exercises, and posture training. Exercises and improved posture can help stabilize the neck, strengthen muscles, and help stop symptoms from returning.   °HOME CARE INSTRUCTIONS  °· Put ice on the injured area.   °¨ Put ice in a plastic bag.   °¨ Place a towel between your skin and the bag.   °¨ Leave the ice on for 15-20 minutes, 3-4 times a day.   °· If your injury was severe, you may have been given a cervical collar to wear. A cervical collar is a two-piece collar designed to keep your neck from moving while it heals. °¨ Do not remove the collar unless instructed by your health care provider. °¨ If you have long hair, keep it outside of the collar. °¨ Ask your health care provider before making any adjustments to your collar. Minor   adjustments may be required over time to improve comfort and reduce pressure on your chin or on the back of your head. °¨ If you are allowed to remove the collar for cleaning or bathing, follow your health care provider's instructions on how to do so safely. °¨ Keep your collar clean by wiping it with mild soap and water and drying it completely. If the collar you have been given includes removable pads, remove them every 1-2 days and hand wash them with soap and water. Allow them to air dry. They should be completely  dry before you wear them in the collar. °¨ If you are allowed to remove the collar for cleaning and bathing, wash and dry the skin of your neck. Check your skin for irritation or sores. If you see any, tell your health care provider. °¨ Do not drive while wearing the collar.   °· Only take over-the-counter or prescription medicines for pain, discomfort, or fever as directed by your health care provider.   °· Keep all follow-up appointments as directed by your health care provider.   °· Keep all physical therapy appointments as directed by your health care provider.   °· Make any needed adjustments to your workstation to promote good posture.   °· Avoid positions and activities that make your symptoms worse.   °· Warm up and stretch before being active to help prevent problems.   °SEEK MEDICAL CARE IF:  °· Your pain is not controlled with medicine.   °· You are unable to decrease your pain medicine over time as planned.   °· Your activity level is not improving as expected.   °SEEK IMMEDIATE MEDICAL CARE IF:  °· You develop any bleeding. °· You develop stomach upset. °· You have signs of an allergic reaction to your medicine.   °· Your symptoms get worse.   °· You develop new, unexplained symptoms.   °· You have numbness, tingling, weakness, or paralysis in any part of your body.   °MAKE SURE YOU:  °· Understand these instructions. °· Will watch your condition. °· Will get help right away if you are not doing well or get worse. °Document Released: 01/09/2007 Document Revised: 03/19/2013 Document Reviewed: 09/19/2012 °[employer]® Patient Information ©2015 [employer]. This information is not intended to replace advice given to you by your health care provider. Make sure you discuss any questions you have with your health care provider. ° °Motor Vehicle Collision °It is common to have multiple bruises and sore muscles after a motor vehicle collision (MVC). These tend to feel worse for the first 24 hours. You may have  the most stiffness and soreness over the first several hours. You may also feel worse when you wake up the first morning after your collision. After this point, you will usually begin to improve with each day. The speed of improvement often depends on the severity of the collision, the number of injuries, and the location and nature of these injuries. °HOME CARE INSTRUCTIONS °· Put ice on the injured area. °¨ Put ice in a plastic bag. °¨ Place a towel between your skin and the bag. °¨ Leave the ice on for 15-20 minutes, 3-4 times a day, or as directed by your health care provider. °· Drink enough fluids to keep your urine clear or pale yellow. Do not drink alcohol. °· Take a warm shower or bath once or twice a day. This will increase blood flow to sore muscles. °· You may return to activities as directed by your caregiver. Be careful when lifting, as this may aggravate neck or back   pain. °· Only take over-the-counter or prescription medicines for pain, discomfort, or fever as directed by your caregiver. Do not use aspirin. This may increase bruising and bleeding. °SEEK IMMEDIATE MEDICAL CARE IF: °· You have numbness, tingling, or weakness in the arms or legs. °· You develop severe headaches not relieved with medicine. °· You have severe neck pain, especially tenderness in the middle of the back of your neck. °· You have changes in bowel or bladder control. °· There is increasing pain in any area of the body. °· You have shortness of breath, light-headedness, dizziness, or fainting. °· You have chest pain. °· You feel sick to your stomach (nauseous), throw up (vomit), or sweat. °· You have increasing abdominal discomfort. °· There is blood in your urine, stool, or vomit. °· You have pain in your shoulder (shoulder strap areas). °· You feel your symptoms are getting worse. °MAKE SURE YOU: °· Understand these instructions. °· Will watch your condition. °· Will get help right away if you are not doing well or get  worse. °Document Released: 03/14/2005 Document Revised: 07/29/2013 Document Reviewed: 08/11/2010 °[employer]® Patient Information ©2015 [employer]. This information is not intended to replace advice given to you by your health care provider. Make sure you discuss any questions you have with your health care provider. ° °

## 2014-09-12 NOTE — ED Notes (Signed)
Pt states she can not receive medication from anyone d/t pain management clinic, she states she will take her medication for pain
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
## 2014-10-08 ENCOUNTER — Ambulatory Visit: Payer: No Typology Code available for payment source | Attending: Internal Medicine | Admitting: Physical Therapy

## 2015-01-31 ENCOUNTER — Other Ambulatory Visit: Payer: Self-pay

## 2015-01-31 DIAGNOSIS — Z1231 Encounter for screening mammogram for malignant neoplasm of breast: Secondary | ICD-10-CM

## 2015-02-17 ENCOUNTER — Ambulatory Visit: Payer: Medicaid Other

## 2015-03-09 ENCOUNTER — Ambulatory Visit
Admission: RE | Admit: 2015-03-09 | Discharge: 2015-03-09 | Disposition: A | Discharge status: Home / Self Care | Payer: Medicaid Other | Source: Ambulatory Visit

## 2015-03-09 DIAGNOSIS — Z1231 Encounter for screening mammogram for malignant neoplasm of breast: Secondary | ICD-10-CM

## 2015-03-10 ENCOUNTER — Ambulatory Visit: Payer: Medicaid Other

## 2016-04-04 ENCOUNTER — Other Ambulatory Visit: Payer: Self-pay | Admitting: Internal Medicine

## 2016-04-04 DIAGNOSIS — Z1231 Encounter for screening mammogram for malignant neoplasm of breast: Secondary | ICD-10-CM

## 2016-04-19 ENCOUNTER — Ambulatory Visit: Payer: Medicaid Other

## 2016-04-22 ENCOUNTER — Ambulatory Visit: Payer: Medicaid Other

## 2016-05-02 ENCOUNTER — Ambulatory Visit: Payer: Medicaid Other

## 2016-05-03 ENCOUNTER — Ambulatory Visit
Admission: RE | Admit: 2016-05-03 | Discharge: 2016-05-03 | Disposition: A | Discharge status: Home / Self Care | Payer: Medicaid Other | Source: Ambulatory Visit | Attending: Internal Medicine | Admitting: Internal Medicine

## 2016-05-03 DIAGNOSIS — Z1231 Encounter for screening mammogram for malignant neoplasm of breast: Secondary | ICD-10-CM

## 2016-10-05 ENCOUNTER — Encounter (HOSPITAL_BASED_OUTPATIENT_CLINIC_OR_DEPARTMENT_OTHER): Payer: Self-pay | Admitting: *Deleted

## 2016-10-05 ENCOUNTER — Emergency Department (HOSPITAL_BASED_OUTPATIENT_CLINIC_OR_DEPARTMENT_OTHER): Payer: Medicaid Other

## 2016-10-05 ENCOUNTER — Emergency Department (HOSPITAL_BASED_OUTPATIENT_CLINIC_OR_DEPARTMENT_OTHER)
Admission: EM | Admit: 2016-10-05 | Discharge: 2016-10-05 | Disposition: A | Discharge status: Home / Self Care | Payer: Medicaid Other | Attending: Emergency Medicine | Admitting: Emergency Medicine

## 2016-10-05 DIAGNOSIS — M542 Cervicalgia: Secondary | ICD-10-CM | POA: Insufficient documentation

## 2016-10-05 DIAGNOSIS — R51 Headache: Secondary | ICD-10-CM | POA: Diagnosis not present

## 2016-10-05 DIAGNOSIS — I10 Essential (primary) hypertension: Secondary | ICD-10-CM | POA: Diagnosis not present

## 2016-10-05 DIAGNOSIS — Z79899 Other long term (current) drug therapy: Secondary | ICD-10-CM | POA: Diagnosis not present

## 2016-10-05 DIAGNOSIS — Y92481 Parking lot as the place of occurrence of the external cause: Secondary | ICD-10-CM | POA: Insufficient documentation

## 2016-10-05 DIAGNOSIS — Y999 Unspecified external cause status: Secondary | ICD-10-CM | POA: Diagnosis not present

## 2016-10-05 DIAGNOSIS — Y9389 Activity, other specified: Secondary | ICD-10-CM | POA: Diagnosis not present

## 2016-10-05 NOTE — Discharge Instructions (Signed)
Continue taking your home pain meds as prescribed. You may also apply ice to affected area for 15-20 minutes 3-4 times daily for additional pain relief. I recommend following up with your primary care provider in 4-5 days if your symptoms have not improved. Please return to the Emergency Department if symptoms worsen or new onset of fever, neck stiffness, visual changes, light sensitivity, abdominal pain, vomiting, urinary symptoms, numbness, tingling, weakness, seizures, syncope.
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
## 2017-05-24 ENCOUNTER — Other Ambulatory Visit: Payer: Self-pay

## 2017-05-24 ENCOUNTER — Encounter (HOSPITAL_COMMUNITY): Payer: Self-pay | Admitting: *Deleted

## 2017-05-24 NOTE — Progress Notes (Signed)
Pt denies SOB, chest pain, and being under the care of a cardiologist. Pt denies having a stress test, echo and cardiac cath. Requested chest x ray and LOV note from PCP, Dr. Antonietta Jewel. Pt made aware to stop taking Aspirin, vitamins, fish oil and herbal medications. Do not take any NSAIDs ie: Ibuprofen, Advil, Naproxen (Aleve), Motrin, Meloxicam (Mobic), BC and Goody Powder. Pt verbalized understanding of all pre-op instructions.

## 2017-05-26 ENCOUNTER — Encounter (HOSPITAL_COMMUNITY)
Admission: RE | Disposition: A | Discharge status: Home / Self Care | Payer: Self-pay | Source: Ambulatory Visit | Attending: Oral Surgery

## 2017-05-26 ENCOUNTER — Ambulatory Visit (HOSPITAL_COMMUNITY): Payer: Medicaid Other | Admitting: Anesthesiology

## 2017-05-26 ENCOUNTER — Ambulatory Visit (HOSPITAL_COMMUNITY)
Admission: RE | Admit: 2017-05-26 | Discharge: 2017-05-26 | Disposition: A | Discharge status: Home / Self Care | Payer: Medicaid Other | Source: Ambulatory Visit | Attending: Oral Surgery | Admitting: Oral Surgery

## 2017-05-26 ENCOUNTER — Encounter (HOSPITAL_COMMUNITY): Payer: Self-pay | Admitting: *Deleted

## 2017-05-26 DIAGNOSIS — G2581 Restless legs syndrome: Secondary | ICD-10-CM | POA: Diagnosis not present

## 2017-05-26 DIAGNOSIS — M270 Developmental disorders of jaws: Secondary | ICD-10-CM | POA: Diagnosis not present

## 2017-05-26 DIAGNOSIS — Z888 Allergy status to other drugs, medicaments and biological substances status: Secondary | ICD-10-CM | POA: Diagnosis not present

## 2017-05-26 DIAGNOSIS — K219 Gastro-esophageal reflux disease without esophagitis: Secondary | ICD-10-CM | POA: Insufficient documentation

## 2017-05-26 DIAGNOSIS — M1990 Unspecified osteoarthritis, unspecified site: Secondary | ICD-10-CM | POA: Insufficient documentation

## 2017-05-26 DIAGNOSIS — I10 Essential (primary) hypertension: Secondary | ICD-10-CM | POA: Insufficient documentation

## 2017-05-26 DIAGNOSIS — F419 Anxiety disorder, unspecified: Secondary | ICD-10-CM | POA: Diagnosis not present

## 2017-05-26 DIAGNOSIS — K029 Dental caries, unspecified: Secondary | ICD-10-CM | POA: Diagnosis not present

## 2017-05-26 DIAGNOSIS — K0530 Chronic periodontitis, unspecified: Secondary | ICD-10-CM | POA: Diagnosis not present

## 2017-05-26 DIAGNOSIS — F329 Major depressive disorder, single episode, unspecified: Secondary | ICD-10-CM | POA: Diagnosis not present

## 2017-05-26 DIAGNOSIS — M898X8 Other specified disorders of bone, other site: Secondary | ICD-10-CM | POA: Diagnosis not present

## 2017-05-26 DIAGNOSIS — Z885 Allergy status to narcotic agent status: Secondary | ICD-10-CM | POA: Diagnosis not present

## 2017-05-26 DIAGNOSIS — Z79899 Other long term (current) drug therapy: Secondary | ICD-10-CM | POA: Diagnosis not present

## 2017-05-26 HISTORY — DX: Restless legs syndrome: G25.81

## 2017-05-26 HISTORY — DX: Dental caries, unspecified: K02.9

## 2017-05-26 HISTORY — DX: Presence of dental prosthetic device (complete) (partial): Z97.2

## 2017-05-26 HISTORY — DX: Presence of spectacles and contact lenses: Z97.3

## 2017-05-26 HISTORY — DX: Unspecified osteoarthritis, unspecified site: M19.90

## 2017-05-26 HISTORY — DX: Gastro-esophageal reflux disease without esophagitis: K21.9

## 2017-05-26 HISTORY — PX: MULTIPLE EXTRACTIONS WITH ALVEOLOPLASTY: SHX5342

## 2017-05-26 LAB — CBC
HCT: 39.7 % (ref 36.0–46.0)
Hemoglobin: 12.7 g/dL (ref 12.0–15.0)
MCH: 26.3 pg (ref 26.0–34.0)
MCHC: 32 g/dL (ref 30.0–36.0)
MCV: 82.2 fL (ref 78.0–100.0)
Platelets: 444 10*3/uL — ABNORMAL HIGH (ref 150–400)
RBC: 4.83 MIL/uL (ref 3.87–5.11)
RDW: 15.1 % (ref 11.5–15.5)
WBC: 9.8 10*3/uL (ref 4.0–10.5)

## 2017-05-26 LAB — BASIC METABOLIC PANEL
Anion gap: 15 (ref 5–15)
BUN: 9 mg/dL (ref 6–20)
CO2: 25 mmol/L (ref 22–32)
Calcium: 9.7 mg/dL (ref 8.9–10.3)
Chloride: 99 mmol/L — ABNORMAL LOW (ref 101–111)
Creatinine, Ser: 0.84 mg/dL (ref 0.44–1.00)
GFR calc Af Amer: >60 mL/min (ref >60)
GFR calc non Af Amer: >60 mL/min (ref >60)
Glucose, Bld: 93 mg/dL (ref 65–99)
Potassium: 3.6 mmol/L (ref 3.5–5.1)
Sodium: 139 mmol/L (ref 135–145)

## 2017-05-26 SURGERY — MULTIPLE EXTRACTION WITH ALVEOLOPLASTY
Anesthesia: General

## 2017-05-26 MED ORDER — PHENYLEPHRINE HCL 10 MG/ML IJ SOLN
INTRAMUSCULAR | Status: DC | PRN
  Administered 2017-05-26: 120 ug via INTRAVENOUS
  Administered 2017-05-26: 80 ug via INTRAVENOUS

## 2017-05-26 MED ORDER — ROCURONIUM BROMIDE 100 MG/10ML IV SOLN
INTRAVENOUS | Status: DC | PRN
  Administered 2017-05-26: 40 mg via INTRAVENOUS

## 2017-05-26 MED ORDER — SUGAMMADEX SODIUM 200 MG/2ML IV SOLN
INTRAVENOUS | Status: DC | PRN
  Administered 2017-05-26: 200 mg via INTRAVENOUS

## 2017-05-26 MED ORDER — LIDOCAINE-EPINEPHRINE 2 %-1:100000 IJ SOLN
INTRAMUSCULAR | Status: DC | PRN
  Administered 2017-05-26: 20 mL via INTRADERMAL

## 2017-05-26 MED ORDER — MIDAZOLAM HCL 2 MG/2ML IJ SOLN
INTRAMUSCULAR | Status: DC | PRN
  Administered 2017-05-26 (×2): 1 mg via INTRAVENOUS

## 2017-05-26 MED ORDER — METOPROLOL TARTRATE 5 MG/5ML IV SOLN
INTRAVENOUS | Status: DC | PRN
  Administered 2017-05-26: 2 mg via INTRAVENOUS

## 2017-05-26 MED ORDER — LIDOCAINE HCL (CARDIAC) 20 MG/ML IV SOLN
INTRAVENOUS | Status: DC | PRN
  Administered 2017-05-26: 100 mg via INTRATRACHEAL

## 2017-05-26 MED ORDER — DEXAMETHASONE SODIUM PHOSPHATE 10 MG/ML IJ SOLN
INTRAMUSCULAR | Status: DC | PRN
  Administered 2017-05-26: 10 mg via INTRAVENOUS

## 2017-05-26 MED ORDER — AMOXICILLIN 500 MG PO CAPS: 500.0000 mg | ORAL_CAPSULE | Freq: Three times a day (TID) | ORAL | 0 refills | Status: DC

## 2017-05-26 MED ORDER — OXYMETAZOLINE HCL 0.05 % NA SOLN
NASAL | Status: AC
Start: 2017-05-26 — End: 2017-05-26
  Filled 2017-05-26: qty 15

## 2017-05-26 MED ORDER — LIDOCAINE-EPINEPHRINE 2 %-1:100000 IJ SOLN
INTRAMUSCULAR | Status: AC
  Filled 2017-05-26: qty 1

## 2017-05-26 MED ORDER — OXYMETAZOLINE HCL 0.05 % NA SOLN
NASAL | Status: DC | PRN
  Administered 2017-05-26: 3 via NASAL

## 2017-05-26 MED ORDER — 0.9 % SODIUM CHLORIDE (POUR BTL) OPTIME
TOPICAL | Status: DC | PRN
  Administered 2017-05-26: 1000 mL

## 2017-05-26 MED ORDER — LACTATED RINGERS IV SOLN
INTRAVENOUS | Status: DC
  Administered 2017-05-26: 09:00:00 via INTRAVENOUS

## 2017-05-26 MED ORDER — PROPOFOL 10 MG/ML IV BOLUS
INTRAVENOUS | Status: DC | PRN
  Administered 2017-05-26: 50 mg via INTRAVENOUS
  Administered 2017-05-26: 150 mg via INTRAVENOUS

## 2017-05-26 MED ORDER — SUCCINYLCHOLINE CHLORIDE 20 MG/ML IJ SOLN
INTRAMUSCULAR | Status: DC | PRN
  Administered 2017-05-26: 60 mg via INTRAVENOUS
  Administered 2017-05-26: 90 mg via INTRAVENOUS

## 2017-05-26 MED ORDER — CEFAZOLIN SODIUM-DEXTROSE 2-4 GM/100ML-% IV SOLN
2.0000 g | INTRAVENOUS | Status: AC
  Administered 2017-05-26: 2 g via INTRAVENOUS
  Filled 2017-05-26: qty 100

## 2017-05-26 MED ORDER — OXYMETAZOLINE HCL 0.05 % NA SOLN
NASAL | Status: DC | PRN
  Administered 2017-05-26: 1

## 2017-05-26 MED ORDER — FENTANYL CITRATE (PF) 250 MCG/5ML IJ SOLN
INTRAMUSCULAR | Status: DC | PRN
  Administered 2017-05-26 (×2): 100 ug via INTRAVENOUS
  Administered 2017-05-26: 50 ug via INTRAVENOUS

## 2017-05-26 MED ORDER — SODIUM CHLORIDE 0.9 % IR SOLN
Status: DC | PRN
  Administered 2017-05-26: 1000 mL

## 2017-05-26 MED ORDER — GLYCOPYRROLATE 0.2 MG/ML IJ SOLN
INTRAMUSCULAR | Status: DC | PRN
  Administered 2017-05-26: 0.2 mg via INTRAVENOUS

## 2017-05-26 MED ORDER — ONDANSETRON HCL 4 MG/2ML IJ SOLN
INTRAMUSCULAR | Status: DC | PRN
  Administered 2017-05-26: 4 mg via INTRAVENOUS

## 2017-05-26 SURGICAL SUPPLY — 29 items
BUR CROSS CUT FISSURE 1.6 (BURR) ×2 IMPLANT
BUR EGG ELITE 4.0 (BURR) ×2 IMPLANT
CANISTER SUCT 3000ML PPV (MISCELLANEOUS) ×2 IMPLANT
COVER SURGICAL LIGHT HANDLE (MISCELLANEOUS) ×2 IMPLANT
CRADLE DONUT ADULT HEAD (MISCELLANEOUS) ×2 IMPLANT
DECANTER SPIKE VIAL GLASS SM (MISCELLANEOUS) ×2 IMPLANT
DRAPE U-SHAPE 76X120 STRL (DRAPES) ×2 IMPLANT
FLUID NSS /IRRIG 1000 ML XXX (MISCELLANEOUS) ×2 IMPLANT
GAUZE PACKING FOLDED 2  STR (GAUZE/BANDAGES/DRESSINGS) ×1
GAUZE PACKING FOLDED 2 STR (GAUZE/BANDAGES/DRESSINGS) ×1 IMPLANT
GLOVE BIO SURGEON STRL SZ 6.5 (GLOVE) ×2 IMPLANT
GLOVE BIO SURGEON STRL SZ7.5 (GLOVE) ×2 IMPLANT
GLOVE BIOGEL PI IND STRL 7.0 (GLOVE) IMPLANT
GLOVE BIOGEL PI INDICATOR 7.0 (GLOVE)
GOWN STRL REUS W/ TWL LRG LVL3 (GOWN DISPOSABLE) ×1 IMPLANT
GOWN STRL REUS W/ TWL XL LVL3 (GOWN DISPOSABLE) ×1 IMPLANT
GOWN STRL REUS W/TWL LRG LVL3 (GOWN DISPOSABLE) ×2
GOWN STRL REUS W/TWL XL LVL3 (GOWN DISPOSABLE) ×2
KIT BASIN OR (CUSTOM PROCEDURE TRAY) ×2 IMPLANT
KIT ROOM TURNOVER OR (KITS) ×2 IMPLANT
NEEDLE 22X1 1/2 (OR ONLY) (NEEDLE) ×4 IMPLANT
NEEDLE 27GAX1X1/2 (NEEDLE) IMPLANT
NS IRRIG 1000ML POUR BTL (IV SOLUTION) ×2 IMPLANT
PAD ARMBOARD 7.5X6 YLW CONV (MISCELLANEOUS) ×2 IMPLANT
SUT CHROMIC 3 0 PS 2 (SUTURE) ×4 IMPLANT
SYR CONTROL 10ML LL (SYRINGE) ×2 IMPLANT
TRAY ENT MC OR (CUSTOM PROCEDURE TRAY) ×2 IMPLANT
TUBING IRRIGATION (MISCELLANEOUS) ×2 IMPLANT
YANKAUER SUCT BULB TIP NO VENT (SUCTIONS) ×2 IMPLANT

## 2017-05-26 NOTE — Transfer of Care (Signed)
Immediate Anesthesia Transfer of Care Note  Patient: Kim Skinner  Procedure(s) Performed: MULTIPLE EXTRACTION WITH ALVEOLOPLASTY (N/A )  Patient Location: PACU  Anesthesia Type:General  Level of Consciousness: awake, alert , oriented and patient cooperative  Airway & Oxygen Therapy: Patient Spontanous Breathing and Patient connected to face mask oxygen  Post-op Assessment: Report given to RN, Post -op Vital signs reviewed and stable, Patient moving all extremities X 4 and Patient able to stick tongue midline  Post vital signs: Reviewed and stable  Last Vitals:  Vitals:   05/26/17 0803 05/26/17 1034  BP: 98/65   Pulse: 86   Resp: 18   Temp: 37.2 C (!) 36 C  SpO2: 99%     Last Pain:  Vitals:   05/26/17 0803  TempSrc: Oral  PainSc:       Patients Stated Pain Goal: 3 (16/10/96 0454)  Complications: No apparent anesthesia complications

## 2017-05-26 NOTE — Anesthesia Postprocedure Evaluation (Signed)
Anesthesia Post Note  Patient: NOAMI BOVE  Procedure(s) Performed: MULTIPLE EXTRACTION WITH ALVEOLOPLASTY (N/A )     Patient location during evaluation: PACU Anesthesia Type: General Level of consciousness: awake and alert Pain management: pain level controlled Vital Signs Assessment: post-procedure vital signs reviewed and stable Respiratory status: spontaneous breathing, nonlabored ventilation, respiratory function stable and patient connected to nasal cannula oxygen Cardiovascular status: blood pressure returned to baseline and stable Postop Assessment: no apparent nausea or vomiting Anesthetic complications: no    Last Vitals:  Vitals:   05/26/17 1107 05/26/17 1113  BP:  127/80  Pulse: 89 90  Resp: (!) 23 (!) 22  Temp: (!) 36.3 C (!) 36.3 C  SpO2: 100% 98%    Last Pain:  Vitals:   05/26/17 1113  TempSrc:   PainSc: 0-No pain                 Barnet Glasgow

## 2017-05-26 NOTE — Op Note (Signed)
05/26/2017  10:21 AM  PATIENT:  Kim Skinner  52 y.o. female  PRE-OPERATIVE DIAGNOSIS:  NON RESTORABLE TEETH # 21, 22, 23, 24,2 5, 26, 27, BILATERAL MANDIBULAR LINGUAL TORI, MAXILLARY EXOSTOSIS  POST-OPERATIVE DIAGNOSIS:  SAME  PROCEDURE:  Procedure(s):  EXTRACTION TEETH # 21, 22, 23, 24,2 5, 26, 27, ALVEOLOPLASTY RIGHT AND LEFT MANDIBLE; REMOVAL BILATERAL MANDIBULAR LINGUAL TORI  SURGEON:  Surgeon(s): Diona Browner, DDS  ANESTHESIA:   local and general  EBL:  minimal  DRAINS: none   SPECIMEN:  No Specimen  COUNTS:  YES  PLAN OF CARE: Discharge to home after PACU  PATIENT DISPOSITION:  PACU - hemodynamically stable.   PROCEDURE DETAILS: Dictation # [id number]  Gae Bon, DMD 05/26/2017 10:21 AM

## 2017-05-26 NOTE — H&P (Signed)
HISTORY AND PHYSICAL  Kim Skinner is a 52 y.o. female patient with CC: painful teeth  No diagnosis found.  Past Medical History:  Diagnosis Date  . Anxiety   . Arthritis   . Dental caries   . Depression   . Electrocution   . Endometriosis   . GERD (gastroesophageal reflux disease)   . Hypertension   . Restless leg syndrome   . Sciatic nerve injury   . Wears dentures   . Wears glasses     Current Facility-Administered Medications  Medication Dose Route Frequency Provider Last Rate Last Dose  . ceFAZolin (ANCEF) IVPB 2g/100 mL premix  2 g Intravenous On Call to OR Diona Browner, DDS      . lactated ringers infusion   Intravenous Continuous Diona Browner, DDS       Allergies  Allergen Reactions  . Morphine And Related Shortness Of Breath and Itching  . Promethazine Hcl Itching   Active Problems:   * No active hospital problems. *  Vitals: Blood pressure 98/65, pulse 86, temperature 98.9 F (37.2 C), temperature source Oral, resp. rate 18, height 5\' 7"  (1.702 m), weight 214 lb (97.1 kg), SpO2 99 %. Lab results: Results for orders placed or performed during the hospital encounter of 05/26/17 (from the past 24 hour(s))  CBC     Status: Abnormal   Collection Time: 05/26/17  7:34 AM  Result Value Ref Range   WBC 9.8 4.0 - 10.5 K/uL   RBC 4.83 3.87 - 5.11 MIL/uL   Hemoglobin 12.7 12.0 - 15.0 g/dL   HCT 39.7 36.0 - 46.0 %   MCV 82.2 78.0 - 100.0 fL   MCH 26.3 26.0 - 34.0 pg   MCHC 32.0 30.0 - 36.0 g/dL   RDW 15.1 11.5 - 15.5 %   Platelets 444 (H) 150 - 400 K/uL   Radiology Results: No results found. General appearance: alert, cooperative and no distress Head: Normocephalic, without obvious abnormality, atraumatic Eyes: negative Nose: Nares normal. Septum midline. Mucosa normal. No drainage or sinus tenderness. Throat: carious lower teeth, bilateral mandibular tori, maxillary exostosis, pharynx clear Neck: no adenopathy, supple, symmetrical, trachea midline and  thyroid not enlarged, symmetric, no tenderness/mass/nodules Resp: clear to auscultation bilaterally Cardio: regular rate and rhythm, S1, S2 normal, no murmur, click, rub or gallop  Assessment:Multiple nonrestorable teeth. Mandibular tori. Maxillary exostosis  Plan: multiple dental extractions with alveoloplasty. Removal tori, exostosis. GA. Nasal. Day surgery.   Diona Browner 05/26/2017

## 2017-05-26 NOTE — Anesthesia Procedure Notes (Signed)
Procedure Name: Intubation Date/Time: 05/26/2017 9:55 AM Performed by: Barnet Glasgow, MD Pre-anesthesia Checklist: Patient identified, Emergency Drugs available, Suction available and Patient being monitored Patient Re-evaluated:Patient Re-evaluated prior to induction Oxygen Delivery Method: Circle system utilized Preoxygenation: Pre-oxygenation with 100% oxygen Induction Type: IV induction Ventilation: Mask ventilation without difficulty Laryngoscope Size: Mac Grade View: Grade I Tube type: Oral Nasal Tubes: Magill forceps- large, utilized Number of attempts: 3 (6.5 would not pass through vocals, attempted by CRNA, then MDA.  Tube placed oral by CRNA) Airway Equipment and Method: Stylet Placement Confirmation: ETT inserted through vocal cords under direct vision Secured at: 23 cm Tube secured with: Tape Dental Injury: Teeth and Oropharynx as per pre-operative assessment and Bloody posterior oropharynx

## 2017-05-26 NOTE — Anesthesia Procedure Notes (Signed)
Performed by: Houser, Stephen A, MD       

## 2017-05-26 NOTE — Anesthesia Preprocedure Evaluation (Addendum)
Anesthesia Evaluation  Patient identified by MRN, date of birth, ID band Patient awake    Reviewed: Allergy & Precautions, NPO status , Patient's Chart, lab work & pertinent test results  Airway Mallampati: II  TM Distance: >3 FB Neck ROM: Full    Dental no notable dental hx.    Pulmonary neg pulmonary ROS,    Pulmonary exam normal breath sounds clear to auscultation       Cardiovascular hypertension, Normal cardiovascular exam Rhythm:Regular Rate:Normal     Neuro/Psych    GI/Hepatic GERD  ,  Endo/Other    Renal/GU      Musculoskeletal  (+) Arthritis ,   Abdominal   Peds  Hematology   Anesthesia Other Findings   Reproductive/Obstetrics                             Anesthesia Physical Anesthesia Plan  ASA: II  Anesthesia Plan: General   Post-op Pain Management:    Induction: Intravenous  PONV Risk Score and Plan: Treatment may vary due to age or medical condition, Ondansetron and Dexamethasone  Airway Management Planned: Oral ETT and Nasal ETT  Additional Equipment:   Intra-op Plan:   Post-operative Plan:   Informed Consent: I have reviewed the patients History and Physical, chart, labs and discussed the procedure including the risks, benefits and alternatives for the proposed anesthesia with the patient or authorized representative who has indicated his/her understanding and acceptance.   Dental advisory given  Plan Discussed with: CRNA and Anesthesiologist  Anesthesia Plan Comments:         Anesthesia Quick Evaluation

## 2017-05-27 ENCOUNTER — Encounter (HOSPITAL_COMMUNITY): Payer: Self-pay | Admitting: Oral Surgery

## 2017-05-29 NOTE — Op Note (Signed)
Kim Skinner, FALCONI NO.:  [id number]  MEDICAL RECORD NO.:  [id number]  LOCATION:                                 FACILITY:  PHYSICIAN:  Gae Bon, M.D.  DATE OF BIRTH:  26-Jun-1965  DATE OF PROCEDURE:  05/26/2017 DATE OF DISCHARGE:  05/26/2017                              OPERATIVE REPORT   PREOPERATIVE DIAGNOSES: 1. Nonrestorable teeth #21, 22, 23, 24, 25, 26, 27 secondary to dental     caries and periodontitis. 2. Bilateral mandibular lingual tori. 3. Maxillary exostosis.  POSTOPERATIVE DIAGNOSES: 1. Nonrestorable teeth #21, 22, 23, 24, 25, 26, 27 secondary to dental     caries and periodontitis. 2. Bilateral mandibular lingual tori. 3. Maxillary exostosis.  PROCEDURES: 1. Extraction of teeth numbers 21, 22, 23, 24, 25, 26, 27. 2. Alveoplasty right and left mandible. 3. Removal of bilateral mandibular lingual tori.  SURGEON:  Gae Bon, M.D.  ANESTHESIA:  General.  DESCRIPTION OF PROCEDURE:  The patient was taken to the operating room, placed on the table in supine position.  General anesthesia was administered and an oral endotracheal tube was placed after failed attempts at nasal intubation.  The tube was secured.  The eyes were protected.  The patient was draped for surgery.  Time-out was performed. The posterior pharynx was suctioned.  A throat pack was placed. Lidocaine 2% with 1:100,000 epinephrine was infiltrated in an inferior alveolar block on the right and left side and then buccal and lingual infiltration of the mandible.  A bite block was placed in the right side of the mouth and the left side was operated first.  A 15 blade was used to make an incision approximately 1 cm proximal to tooth #21 on the alveolar crest and then carried forward buccally and lingually in the gingival sulcus until tooth #27 was encountered.  The periosteum was reflected from around these teeth.  The teeth were elevated with a 301 elevator.  They  were removed from the mouth with the Asch forceps except for tooth #21, which fractured upon attempted removal.  The Stryker handpiece with a fissure bur was then used under irrigation to remove bone around the tooth and then the tooth was removed with the Asch forceps.  Then, the gingiva was trimmed and the periosteum was reflected to expose the alveolar crest and the left lingual torus.  The alveoplasty of the lingual crest was performed with an egg-shaped bur and bone file.  Then, the torus was removed using a Seldin retractor to protect the lingual floor of mouth tissues, and the egg-shaped bur and then bone file.  After these procedures were performed, the areas were irrigated and closed with 3-0 chromic and the bite block and sweetheart retractor repositioned to the other side of the mouth.  A 15 blade was used to make incision 1 cm proximal to tooth #27 along the alveolar crest and then carried forward buccally and lingually in the gingival sulcus around this tooth to join the other incision.  The periosteum was reflected.  The tooth was elevated with a 301 elevator and removed from the mouth with the Asch forceps.  Then, the periosteum was  reflected distally buccally and lingually to expose the alveolar crest, which was irregular in contour along with the lingual torus.  Then, the egg-shaped bur and bone file were used to perform alveoplasty in the left mandible and also to remove the left lingual torus.  The bone file was then used to further smooth the areas and the area was irrigated and closed with 3- 0 chromic.  The maxillary exostosis was felt to be minimal and it was elected not to remove or reduce this as the patient was currently wearing a partial denture, which fit around the exostosis.  Then, the oral cavity was irrigated and suctioned.  Throat pack was removed.  The patient was left in care of anesthesia for extubation, awaiting transportation to recovery room with  plans for discharge home through Day Surgery.  ESTIMATED BLOOD LOSS:  Minimal.  COMPLICATIONS:  None.  SPECIMENS:  None.     Gae Bon, M.D.     SMJ/MEDQ  D:  05/26/2017  T:  05/26/2017  Job:  [id number]

## 2017-06-27 ENCOUNTER — Other Ambulatory Visit: Payer: Self-pay | Admitting: Internal Medicine

## 2017-06-27 DIAGNOSIS — Z139 Encounter for screening, unspecified: Secondary | ICD-10-CM

## 2017-07-20 ENCOUNTER — Ambulatory Visit: Payer: Medicaid Other

## 2017-07-21 ENCOUNTER — Ambulatory Visit: Payer: Medicaid Other

## 2017-08-15 ENCOUNTER — Ambulatory Visit
Admission: RE | Admit: 2017-08-15 | Discharge: 2017-08-15 | Disposition: A | Discharge status: Home / Self Care | Payer: Medicaid Other | Source: Ambulatory Visit | Attending: Internal Medicine | Admitting: Internal Medicine

## 2017-08-15 DIAGNOSIS — Z139 Encounter for screening, unspecified: Secondary | ICD-10-CM

## 2017-08-16 ENCOUNTER — Ambulatory Visit: Payer: Medicaid Other

## 2017-10-16 ENCOUNTER — Other Ambulatory Visit: Payer: Self-pay | Admitting: Neurosurgery

## 2017-10-16 DIAGNOSIS — M4722 Other spondylosis with radiculopathy, cervical region: Secondary | ICD-10-CM

## 2017-10-27 ENCOUNTER — Inpatient Hospital Stay
Admission: RE | Admit: 2017-10-27 | Discharge: 2017-10-27 | Disposition: A | Discharge status: Home / Self Care | Payer: Medicaid Other | Source: Ambulatory Visit | Attending: Neurosurgery | Admitting: Neurosurgery

## 2017-11-07 ENCOUNTER — Ambulatory Visit
Admission: RE | Admit: 2017-11-07 | Discharge: 2017-11-07 | Disposition: A | Discharge status: Home / Self Care | Payer: Medicaid Other | Source: Ambulatory Visit | Attending: Neurosurgery | Admitting: Neurosurgery

## 2017-11-07 DIAGNOSIS — M4722 Other spondylosis with radiculopathy, cervical region: Secondary | ICD-10-CM

## 2018-02-01 ENCOUNTER — Other Ambulatory Visit: Payer: Self-pay | Admitting: Orthopedic Surgery

## 2018-02-12 NOTE — Pre-Procedure Instructions (Signed)
Kim Skinner  02/12/2018      [name] Kim Skinner, [place] [address] Phone: [phone] Fax: [phone]    Your procedure is scheduled on February 19, 2018.  Report to [facility] Admitting at 530 AM.  Call this number if you have problems the morning of surgery:  [phone]   Remember:  Do not eat or drink after midnight.    Take these medicines the morning of surgery with A SIP OF WATER  Amlodipine (norvasc) Atenolol (tenormin) Baclofen (lioresal) Gabapentin (neurontin) Omeprazole (prilosec) oxycontin Oxycodone-acetaminophen (percocet)-if needed for pain Ropinirole (requip)-if needed for pain  7 days prior to surgery STOP taking any Aspirin (unless otherwise instructed by your surgeon), Aleve, Naproxen, Ibuprofen, Motrin, Advil, Goody's, BC's, all herbal medications, fish oil, and all vitamins     Do not wear jewelry, make-up or nail polish.  Do not wear lotions, powders, or perfumes, or deodorant.  Do not shave 48 hours prior to surgery.   Do not bring valuables to the hospital.  Kim Skinner is not responsible for any belongings or valuables.  Contacts, dentures or bridgework may not be worn into surgery.  Leave your suitcase in the car.  After surgery it may be brought to your room.  For patients admitted to the hospital, discharge time will be determined by your treatment team.  Patients discharged the day of surgery will not be allowed to drive home.    Kim Skinner- Preparing For Surgery  Before surgery, you can play an important role. Because skin is not sterile, your skin needs to be as free of germs as possible. You can reduce the number of germs on your skin by washing with CHG (chlorahexidine gluconate) Soap before surgery.  CHG is an antiseptic cleaner which kills germs and bonds with the skin to continue killing germs even  after washing.    Oral Hygiene is also important to reduce your risk of infection.  Remember - BRUSH YOUR TEETH THE MORNING OF SURGERY WITH YOUR REGULAR TOOTHPASTE  Please do not use if you have an allergy to CHG or antibacterial soaps. If your skin becomes reddened/irritated stop using the CHG.  Do not shave (including legs and underarms) for at least 48 hours prior to first CHG shower. It is OK to shave your face.  Please follow these instructions carefully.   1. Shower the NIGHT BEFORE SURGERY and the MORNING OF SURGERY with CHG.   2. If you chose to wash your hair, wash your hair first as usual with your normal shampoo.  3. After you shampoo, rinse your hair and body thoroughly to remove the shampoo.  4. Use CHG as you would any other liquid soap. You can apply CHG directly to the skin and wash gently with a scrungie or a clean washcloth.   5. Apply the CHG Soap to your body ONLY FROM THE NECK DOWN.  Do not use on open wounds or open sores. Avoid contact with your eyes, ears, mouth and genitals (private parts). Wash Face and genitals (private parts)  with your normal soap.  6. Wash thoroughly, paying special attention to the area where your surgery will be performed.  7. Thoroughly rinse your body with warm water from the neck down.  8. DO NOT shower/wash with your normal soap after using and rinsing off the CHG Soap.  9. Pat yourself  dry with a CLEAN TOWEL.  10. Wear CLEAN PAJAMAS to bed the night before surgery, wear comfortable clothes the morning of surgery  11. Place CLEAN SHEETS on your bed the night of your first shower and DO NOT SLEEP WITH PETS.  Day of Surgery:  Do not apply any deodorants/lotions.  Please wear clean clothes to the hospital/surgery center.   Remember to brush your teeth WITH YOUR REGULAR TOOTHPASTE.  Please read over the following fact sheets that you were given.

## 2018-02-12 NOTE — Progress Notes (Addendum)
PCP: Antonietta Jewel, MD  Cardiologist: pt denies  EKG: 02/13/18 at PAT appt  Stress test: pt denies  ECHO: pt denies  Cardiac Cath: pt denies  Chest x-ray: 02/13/18 at PAT appt

## 2018-02-13 ENCOUNTER — Encounter (HOSPITAL_COMMUNITY): Payer: Self-pay

## 2018-02-13 ENCOUNTER — Ambulatory Visit (HOSPITAL_COMMUNITY)
Admission: RE | Admit: 2018-02-13 | Discharge: 2018-02-13 | Disposition: A | Discharge status: Home / Self Care | Payer: Medicaid Other | Source: Ambulatory Visit | Attending: Orthopedic Surgery | Admitting: Orthopedic Surgery

## 2018-02-13 ENCOUNTER — Other Ambulatory Visit: Payer: Self-pay

## 2018-02-13 ENCOUNTER — Encounter (HOSPITAL_COMMUNITY)
Admission: RE | Admit: 2018-02-13 | Discharge: 2018-02-13 | Disposition: A | Discharge status: Home / Self Care | Payer: Medicaid Other | Source: Ambulatory Visit | Attending: Orthopedic Surgery | Admitting: Orthopedic Surgery

## 2018-02-13 DIAGNOSIS — Z01818 Encounter for other preprocedural examination: Secondary | ICD-10-CM | POA: Insufficient documentation

## 2018-02-13 DIAGNOSIS — I10 Essential (primary) hypertension: Secondary | ICD-10-CM | POA: Insufficient documentation

## 2018-02-13 LAB — COMPREHENSIVE METABOLIC PANEL
ALT: 11 U/L (ref 0–44)
AST: 19 U/L (ref 15–41)
Albumin: 3.9 g/dL (ref 3.5–5.0)
Alkaline Phosphatase: 49 U/L (ref 38–126)
Anion gap: 6 (ref 5–15)
BUN: 8 mg/dL (ref 6–20)
CO2: 29 mmol/L (ref 22–32)
Calcium: 9.4 mg/dL (ref 8.9–10.3)
Chloride: 106 mmol/L (ref 98–111)
Creatinine, Ser: 0.88 mg/dL (ref 0.44–1.00)
GFR calc Af Amer: >60 mL/min (ref >60)
GFR calc non Af Amer: >60 mL/min (ref >60)
Glucose, Bld: 113 mg/dL — ABNORMAL HIGH (ref 70–99)
Potassium: 3.4 mmol/L — ABNORMAL LOW (ref 3.5–5.1)
Sodium: 141 mmol/L (ref 135–145)
Total Bilirubin: 0.4 mg/dL (ref 0.3–1.2)
Total Protein: 7.4 g/dL (ref 6.5–8.1)

## 2018-02-13 LAB — URINALYSIS, ROUTINE W REFLEX MICROSCOPIC
Glucose, UA: NEGATIVE mg/dL
Hgb urine dipstick: NEGATIVE
Ketones, ur: NEGATIVE mg/dL
Nitrite: NEGATIVE
Protein, ur: 30 mg/dL — AB
Specific Gravity, Urine: 1.032 — ABNORMAL HIGH (ref 1.005–1.030)
pH: 5 (ref 5.0–8.0)

## 2018-02-13 LAB — CBC WITH DIFFERENTIAL/PLATELET
Abs Immature Granulocytes: 0.01 10*3/uL (ref 0.00–0.07)
Basophils Absolute: 0 10*3/uL (ref 0.0–0.1)
Basophils Relative: 1 %
Eosinophils Absolute: 0.2 10*3/uL (ref 0.0–0.5)
Eosinophils Relative: 2 %
HCT: 42.4 % (ref 36.0–46.0)
Hemoglobin: 12.7 g/dL (ref 12.0–15.0)
Immature Granulocytes: 0 %
Lymphocytes Relative: 38 %
Lymphs Abs: 2.4 10*3/uL (ref 0.7–4.0)
MCH: 24.7 pg — ABNORMAL LOW (ref 26.0–34.0)
MCHC: 30 g/dL (ref 30.0–36.0)
MCV: 82.5 fL (ref 80.0–100.0)
Monocytes Absolute: 0.3 10*3/uL (ref 0.1–1.0)
Monocytes Relative: 5 %
Neutro Abs: 3.4 10*3/uL (ref 1.7–7.7)
Neutrophils Relative %: 54 %
Platelets: 343 10*3/uL (ref 150–400)
RBC: 5.14 MIL/uL — ABNORMAL HIGH (ref 3.87–5.11)
RDW: 14.6 % (ref 11.5–15.5)
WBC: 6.3 10*3/uL (ref 4.0–10.5)
nRBC: 0 % (ref 0.0–0.2)

## 2018-02-13 LAB — ABO/RH: ABO/RH(D): O NEG

## 2018-02-13 LAB — TYPE AND SCREEN
ABO/RH(D): O NEG
Antibody Screen: NEGATIVE

## 2018-02-13 LAB — APTT: aPTT: 33 seconds (ref 24–36)

## 2018-02-13 LAB — PROTIME-INR
INR: 0.95
Prothrombin Time: 12.6 seconds (ref 11.4–15.2)

## 2018-02-13 LAB — SURGICAL PCR SCREEN
MRSA, PCR: NEGATIVE
Staphylococcus aureus: POSITIVE — AB

## 2018-02-13 NOTE — Progress Notes (Signed)
Called and left a confidential voice message for Kim Skinner. At Dr. Berenice Primas office informing her of abnormal urine and other abnormal labs including PCR.  Left call back # for any questions.  Called mupirocin ointment in to [facility] on [address] per patient request. Called and informed patient that PCR positive for staph and that her prescription has been called into [facility].  Advised to call back if there are any problems picking it up, she verbalized understanding.

## 2018-02-16 MED ORDER — BUPIVACAINE LIPOSOME 1.3 % IJ SUSP
10.0000 mL | Freq: Once | INTRAMUSCULAR | Status: DC
Start: 2018-02-19 — End: 2018-02-19
  Filled 2018-02-16: qty 10

## 2018-02-16 MED ORDER — TRANEXAMIC ACID-NACL 1000-0.7 MG/100ML-% IV SOLN
1000.0000 mg | INTRAVENOUS | Status: AC
  Administered 2018-02-19: 1000 mg via INTRAVENOUS
  Filled 2018-02-16: qty 100

## 2018-02-18 NOTE — Anesthesia Preprocedure Evaluation (Addendum)
Anesthesia Evaluation  Patient identified by MRN, date of birth, ID band Patient awake    Reviewed: Allergy & Precautions, NPO status , Patient's Chart, lab work & pertinent test results  Airway Mallampati: II  TM Distance: >3 FB Neck ROM: Full    Dental  (+) Edentulous Upper, Edentulous Lower   Pulmonary neg pulmonary ROS,    breath sounds clear to auscultation       Cardiovascular hypertension, Pt. on medications and Pt. on home beta blockers negative cardio ROS   Rhythm:Regular Rate:Normal     Neuro/Psych PSYCHIATRIC DISORDERS Anxiety Depression negative neurological ROS     GI/Hepatic Neg liver ROS, GERD  ,  Endo/Other  negative endocrine ROS  Renal/GU negative Renal ROS  negative genitourinary   Musculoskeletal  (+) Arthritis , Osteoarthritis,    Abdominal   Peds  Hematology negative hematology ROS (+)   Anesthesia Other Findings   Reproductive/Obstetrics                            Anesthesia Physical Anesthesia Plan  ASA: II  Anesthesia Plan: General   Post-op Pain Management:    Induction: Intravenous  PONV Risk Score and Plan: 3 and Propofol infusion, Dexamethasone and Ondansetron  Airway Management Planned: Oral ETT  Additional Equipment:   Intra-op Plan:   Post-operative Plan: Extubation in OR  Informed Consent: I have reviewed the patients History and Physical, chart, labs and discussed the procedure including the risks, benefits and alternatives for the proposed anesthesia with the patient or authorized representative who has indicated his/her understanding and acceptance.   Dental advisory given  Plan Discussed with: CRNA  Anesthesia Plan Comments: (Pt does not want spinal anesthesia. )      Anesthesia Quick Evaluation

## 2018-02-18 NOTE — H&P (Addendum)
TOTAL HIP ADMISSION H&P  Patient is admitted for left total hip arthroplasty.  Subjective:  Chief Complaint: left hip pain  HPI: Kim Skinner, 52 y.o. female, has a history of pain and functional disability in the left hip(s) due to arthritis and patient has failed non-surgical conservative treatments for greater than 12 weeks to include NSAID's and/or analgesics, corticosteriod injections and activity modification.  Onset of symptoms was gradual starting 2 years ago with gradually worsening course since that time.The patient noted no past surgery on the left hip(s).  Patient currently rates pain in the left hip at 9 out of 10 with activity. Patient has night pain, worsening of pain with activity and weight bearing, trendelenberg gait, pain that interfers with activities of daily living, pain with passive range of motion, crepitus and joint swelling. Patient has evidence of subchondral cysts, periarticular osteophytes and joint space narrowing by imaging studies. This condition presents safety issues increasing the risk of falls. This patient has had ffailure of all rreasonable conservative care.  There is no current active infection.  Patient Active Problem List   Diagnosis Date Noted  . Low back pain 09/11/2012  . Cholecystitis with cholelithiasis 05/11/2012  . S/P laparoscopic cholecystectomy 05/11/2012  . Sciatic nerve injury    Past Medical History:  Diagnosis Date  . Anxiety   . Arthritis   . Dental caries   . Depression   . Electrocution   . Endometriosis   . GERD (gastroesophageal reflux disease)   . Hypertension   . Restless leg syndrome   . Sciatic nerve injury   . Wears dentures   . Wears glasses     Past Surgical History:  Procedure Laterality Date  . CHOLECYSTECTOMY N/A 05/11/2012   Procedure: LAPAROSCOPIC CHOLECYSTECTOMY WITH INTRAOPERATIVE CHOLANGIOGRAM;  Surgeon: Shann Medal, MD;  Location: [facility];  Service: General;  Laterality: N/A;  . MULTIPLE EXTRACTIONS  WITH ALVEOLOPLASTY N/A 05/26/2017   Procedure: MULTIPLE EXTRACTION WITH ALVEOLOPLASTY;  Surgeon: Diona Browner, DDS;  Location: [facility];  Service: Oral Surgery;  Laterality: N/A;  . MULTIPLE TOOTH EXTRACTIONS    . TUBAL LIGATION      Current Facility-Administered Medications  Medication Dose Route Frequency Provider Last Rate Last Dose  . [START ON 02/19/2018] bupivacaine liposome (EXPAREL) 1.3 % injection 133 mg  10 mL Infiltration Once Dorna Leitz, MD      . Derrill Memo ON 02/19/2018] tranexamic acid (CYKLOKAPRON) IVPB 1,000 mg  1,000 mg Intravenous To OR Dorna Leitz, MD       Current Outpatient Medications  Medication Sig Dispense Refill Last Dose  . amitriptyline (ELAVIL) 50 MG tablet Take 50-100 mg by mouth at bedtime. 1-2 tablet depending on pain   05/25/2017 at Unknown time  . amLODipine (NORVASC) 10 MG tablet Take 10 mg by mouth daily.   05/25/2017 at Unknown time  . atenolol (TENORMIN) 25 MG tablet Take 25 mg by mouth daily.   05/25/2017 at Unknown time  . baclofen (LIORESAL) 10 MG tablet Take 10 mg by mouth 4 (four) times daily as needed for muscle spasms.    05/25/2017 at Unknown time  . diazepam (VALIUM) 5 MG tablet Take 5 mg by mouth at bedtime.    05/25/2017 at Unknown time  . gabapentin (NEURONTIN) 400 MG capsule Take 400 mg by mouth 4 (four) times daily.    05/26/2017 at 0500  . meloxicam (MOBIC) 15 MG tablet Take 15 mg by mouth daily.   Past Week at Unknown time  . omeprazole (  PRILOSEC) 20 MG capsule Take 20 mg by mouth daily.   05/25/2017 at Unknown time  . OxyCODONE (OXYCONTIN) 20 mg T12A 12 hr tablet Take 20 mg by mouth every 12 (twelve) hours.   09/12/2014 at am  . oxyCODONE-acetaminophen (PERCOCET) 10-325 MG per tablet Take 1 tablet by mouth every 6 (six) hours as needed for pain.    05/26/2017 at 0500  . rOPINIRole (REQUIP) 0.25 MG tablet Take 0.25-0.5 mg by mouth See admin instructions. Take 0.51m by mouth at bedtime and 0.239mduring day if needed for pain   05/25/2017 at Unknown time  .  amoxicillin (AMOXIL) 500 MG capsule Take 1 capsule (500 mg total) by mouth 3 (three) times daily. (Patient not taking: Reported on 02/07/2018) 21 capsule 0 Not Taking at Unknown time   Allergies  Allergen Reactions  . Morphine And Related Shortness Of Breath and Itching  . Promethazine Hcl Itching    Pt knows medication by phenergan    Social History   Tobacco Use  . Smoking status: Never Smoker  . Smokeless tobacco: Never Used  Substance Use Topics  . Alcohol use: No    Family History  Problem Relation Age of Onset  . Heart attack Mother   . Hypertension Mother   . Cancer Mother        Breast cancer  . Breast cancer Mother [id number]   ROS ROS: I have reviewed the patient's review of systems thoroughly and there are no positive responses as relates to the HPI. Objective:  Physical Exam  Vital signs in last 24 hours:    Vitals:   02/19/18 0600  BP: 133/76  Pulse: 70  Resp: 20  Temp: 98.5 F (36.9 C)  SpO2: 100%   Well-developed well-nourished patient in no acute distress. Alert and oriented x3 HEENT:within normal limits Cardiac: Regular rate and rhythm Pulmonary: Lungs clear to auscultation Abdomen: Soft and nontender.  Normal active bowel sounds  Musculoskeletal: left hip: Limited range of motion.  Painful range of motion.  Limited internal rotation.  Neurovascular intact distally. Labs: Recent Results (from the past 2160 hour(s))  Type and screen Order type and screen if day of surgery is less than 15 days from draw of preadmission visit or order morning of surgery if day of surgery is greater than 6 days from preadmission visit.     Status: None   Collection Time: 02/13/18  9:36 AM  Result Value Ref Range   ABO/RH(D) O NEG    Antibody Screen NEG    Sample Expiration 02/27/2018    Extend sample reason      NO TRANSFUSIONS OR PREGNANCY IN THE PAST 3 MONTHS Performed at [facility] [place] [id number] Surgical pcr screen      Status: Abnormal   Collection Time: 02/13/18  9:48 AM  Result Value Ref Range   MRSA, PCR NEGATIVE NEGATIVE   Staphylococcus aureus POSITIVE (A) NEGATIVE    Comment: (NOTE) The Xpert SA Assay (FDA approved for NASAL specimens in patients 2245ears of age and older), is one component of a comprehensive surveillance program. It is not intended to diagnose infection nor to guide or monitor treatment.   APTT     Status: None   Collection Time: 02/13/18  9:48 AM  Result Value Ref Range   aPTT 33 24 - 36 seconds    Comment: Performed at [facility] [place] [address]  CBC WITH DIFFERENTIAL     Status: Abnormal   Collection Time: 02/13/18  9:48 AM  Result Value Ref Range   WBC 6.3 4.0 - 10.5 K/uL   RBC 5.14 (H) 3.87 - 5.11 MIL/uL   Hemoglobin 12.7 12.0 - 15.0 g/dL   HCT 42.4 36.0 - 46.0 %   MCV 82.5 80.0 - 100.0 fL   MCH 24.7 (L) 26.0 - 34.0 pg   MCHC 30.0 30.0 - 36.0 g/dL   RDW 14.6 11.5 - 15.5 %   Platelets 343 150 - 400 K/uL   nRBC 0.0 0.0 - 0.2 %   Neutrophils Relative % 54 %   Neutro Abs 3.4 1.7 - 7.7 K/uL   Lymphocytes Relative 38 %   Lymphs Abs 2.4 0.7 - 4.0 K/uL   Monocytes Relative 5 %   Monocytes Absolute 0.3 0.1 - 1.0 K/uL   Eosinophils Relative 2 %   Eosinophils Absolute 0.2 0.0 - 0.5 K/uL   Basophils Relative 1 %   Basophils Absolute 0.0 0.0 - 0.1 K/uL   Immature Granulocytes 0 %   Abs Immature Granulocytes 0.01 0.00 - 0.07 K/uL    Comment: Performed at [facility], [address], [place], [place] [address]  Comprehensive metabolic panel     Status: Abnormal   Collection Time: 02/13/18  9:48 AM  Result Value Ref Range   Sodium 141 135 - 145 mmol/L   Potassium 3.4 (L) 3.5 - 5.1 mmol/L   Chloride 106 98 - 111 mmol/L   CO2 29 22 - 32 mmol/L   Glucose, Bld 113 (H) 70 - 99 mg/dL   BUN 8 6 - 20 mg/dL   Creatinine, Ser 0.88 0.44 - 1.00 mg/dL   Calcium 9.4 8.9 - 10.3 mg/dL   Total Protein 7.4 6.5 - 8.1 g/dL   Albumin 3.9 3.5 -  5.0 g/dL   AST 19 15 - 41 U/L   ALT 11 0 - 44 U/L   Alkaline Phosphatase 49 38 - 126 U/L   Total Bilirubin 0.4 0.3 - 1.2 mg/dL   GFR calc non Af Amer >60 >60 mL/min   GFR calc Af Amer >60 >60 mL/min    Comment: (NOTE) The eGFR has been calculated using the CKD EPI equation. This calculation has not been validated in all clinical situations. eGFR's persistently <60 mL/min signify possible Chronic Kidney Disease.    Anion gap 6 5 - 15    Comment: Performed at [facility] [address], [place], [place] [address]  Protime-INR     Status: None   Collection Time: 02/13/18  9:48 AM  Result Value Ref Range   Prothrombin Time 12.6 11.4 - 15.2 seconds   INR 0.95     Comment: Performed at [facility] [address], [place], [place] [address]  Urinalysis, Routine w reflex microscopic     Status: Abnormal   Collection Time: 02/13/18  9:48 AM  Result Value Ref Range   Color, Urine AMBER (A) YELLOW    Comment: BIOCHEMICALS MAY BE AFFECTED BY COLOR   APPearance HAZY (A) CLEAR   Specific Gravity, Urine 1.032 (H) 1.005 - 1.030   pH 5.0 5.0 - 8.0   Glucose, UA NEGATIVE NEGATIVE mg/dL   Hgb urine dipstick NEGATIVE NEGATIVE   Bilirubin Urine SMALL (A) NEGATIVE   Ketones, ur NEGATIVE NEGATIVE mg/dL   Protein, ur 30 (A) NEGATIVE mg/dL   Nitrite NEGATIVE NEGATIVE   Leukocytes, UA MODERATE (A) NEGATIVE  RBC / HPF 0-5 0 - 5 RBC/hpf   WBC, UA 11-20 0 - 5 WBC/hpf   Bacteria, UA FEW (A) NONE SEEN   Squamous Epithelial / LPF 21-50 0 - 5   Mucus PRESENT     Comment: Performed at [facility], [place] [address], [place], [place] [address]  ABO/Rh     Status: None   Collection Time: 02/13/18 10:00 AM  Result Value Ref Range   ABO/RH(D)      O NEG Performed at [facility] [address] [place],  [address]     Estimated body mass index is 30.08 kg/m as calculated from the following:   Height as of 02/13/18: 5' 6.5" (1.689 m).   Weight as of 02/13/18: 85.8  kg.   Imaging Review Plain radiographs demonstrate severe degenerative joint disease of the left hip(s). The bone quality appears to be fair for age and reported activity level.    Preoperative templating of the joint replacement has been completed, documented, and submitted to the Operating Room personnel in order to optimize intra-operative equipment management.     Assessment/Plan:  End stage arthritis, left hip(s)  The patient history, physical examination, clinical judgement of the provider and imaging studies are consistent with end stage degenerative joint disease of the left hip(s) and total hip arthroplasty is deemed medically necessary. The treatment options including medical management, injection therapy, arthroscopy and arthroplasty were discussed at length. The risks and benefits of total hip arthroplasty were presented and reviewed. The risks due to aseptic loosening, infection, stiffness, dislocation/subluxation,  thromboembolic complications and other imponderables were discussed.  The patient acknowledged the explanation, agreed to proceed with the plan and consent was signed. Patient is being admitted for inpatient treatment for surgery, pain control, PT, OT, prophylactic antibiotics, VTE prophylaxis, progressive ambulation and ADL's and discharge planning.The patient is planning to be discharged home with home health services

## 2018-02-19 ENCOUNTER — Inpatient Hospital Stay (HOSPITAL_COMMUNITY): Payer: Medicaid Other

## 2018-02-19 ENCOUNTER — Other Ambulatory Visit: Payer: Self-pay

## 2018-02-19 ENCOUNTER — Inpatient Hospital Stay (HOSPITAL_COMMUNITY): Payer: Medicaid Other | Admitting: Anesthesiology

## 2018-02-19 ENCOUNTER — Encounter (HOSPITAL_COMMUNITY): Payer: Self-pay

## 2018-02-19 ENCOUNTER — Encounter (HOSPITAL_COMMUNITY)
Admission: RE | Disposition: A | Discharge status: Home Health | Payer: Self-pay | Source: Ambulatory Visit | Attending: Orthopedic Surgery

## 2018-02-19 ENCOUNTER — Inpatient Hospital Stay (HOSPITAL_COMMUNITY)
Admission: RE | Admit: 2018-02-19 | Discharge: 2018-02-21 | DRG: 470 | Disposition: A | Discharge status: Home Health | Payer: Medicaid Other | Source: Ambulatory Visit | Attending: Orthopedic Surgery | Admitting: Orthopedic Surgery

## 2018-02-19 DIAGNOSIS — Z419 Encounter for procedure for purposes other than remedying health state, unspecified: Secondary | ICD-10-CM

## 2018-02-19 DIAGNOSIS — D62 Acute posthemorrhagic anemia: Secondary | ICD-10-CM | POA: Diagnosis not present

## 2018-02-19 DIAGNOSIS — I10 Essential (primary) hypertension: Secondary | ICD-10-CM | POA: Diagnosis present

## 2018-02-19 DIAGNOSIS — Z888 Allergy status to other drugs, medicaments and biological substances status: Secondary | ICD-10-CM | POA: Diagnosis not present

## 2018-02-19 DIAGNOSIS — Z885 Allergy status to narcotic agent status: Secondary | ICD-10-CM

## 2018-02-19 DIAGNOSIS — G2581 Restless legs syndrome: Secondary | ICD-10-CM | POA: Diagnosis present

## 2018-02-19 DIAGNOSIS — Z791 Long term (current) use of non-steroidal anti-inflammatories (NSAID): Secondary | ICD-10-CM

## 2018-02-19 DIAGNOSIS — K219 Gastro-esophageal reflux disease without esophagitis: Secondary | ICD-10-CM | POA: Diagnosis present

## 2018-02-19 DIAGNOSIS — F329 Major depressive disorder, single episode, unspecified: Secondary | ICD-10-CM | POA: Diagnosis present

## 2018-02-19 DIAGNOSIS — Z79899 Other long term (current) drug therapy: Secondary | ICD-10-CM

## 2018-02-19 DIAGNOSIS — F419 Anxiety disorder, unspecified: Secondary | ICD-10-CM | POA: Diagnosis present

## 2018-02-19 DIAGNOSIS — M1612 Unilateral primary osteoarthritis, left hip: Secondary | ICD-10-CM | POA: Diagnosis present

## 2018-02-19 HISTORY — PX: TOTAL HIP ARTHROPLASTY: SHX124

## 2018-02-19 SURGERY — ARTHROPLASTY, HIP, TOTAL, ANTERIOR APPROACH
Anesthesia: Spinal | Laterality: Left

## 2018-02-19 MED ORDER — FENTANYL CITRATE (PF) 250 MCG/5ML IJ SOLN
INTRAMUSCULAR | Status: AC
  Filled 2018-02-19: qty 5

## 2018-02-19 MED ORDER — ATENOLOL 50 MG PO TABS
25.0000 mg | ORAL_TABLET | Freq: Every day | ORAL | Status: DC
  Administered 2018-02-19 – 2018-02-21 (×3): 25 mg via ORAL
  Filled 2018-02-19 (×3): qty 1

## 2018-02-19 MED ORDER — PANTOPRAZOLE SODIUM 40 MG PO TBEC
40.0000 mg | DELAYED_RELEASE_TABLET | Freq: Every day | ORAL | Status: DC
  Administered 2018-02-19 – 2018-02-20 (×2): 40 mg via ORAL
  Filled 2018-02-19 (×3): qty 1

## 2018-02-19 MED ORDER — PHENYLEPHRINE 40 MCG/ML (10ML) SYRINGE FOR IV PUSH (FOR BLOOD PRESSURE SUPPORT)
PREFILLED_SYRINGE | INTRAVENOUS | Status: DC | PRN
  Administered 2018-02-19 (×3): 80 ug via INTRAVENOUS

## 2018-02-19 MED ORDER — EPHEDRINE SULFATE-NACL 50-0.9 MG/10ML-% IV SOSY
PREFILLED_SYRINGE | INTRAVENOUS | Status: DC | PRN
  Administered 2018-02-19: 5 mg via INTRAVENOUS
  Administered 2018-02-19: 10 mg via INTRAVENOUS

## 2018-02-19 MED ORDER — DOCUSATE SODIUM 100 MG PO CAPS: 100.0000 mg | ORAL_CAPSULE | Freq: Two times a day (BID) | ORAL | 0 refills | Status: AC

## 2018-02-19 MED ORDER — MIDAZOLAM HCL 5 MG/5ML IJ SOLN
INTRAMUSCULAR | Status: DC | PRN
  Administered 2018-02-19: 2 mg via INTRAVENOUS

## 2018-02-19 MED ORDER — METHOCARBAMOL 500 MG PO TABS
ORAL_TABLET | ORAL | Status: AC
  Filled 2018-02-19: qty 1

## 2018-02-19 MED ORDER — FENTANYL CITRATE (PF) 100 MCG/2ML IJ SOLN: 25.0000 ug | INTRAMUSCULAR | Status: DC | PRN

## 2018-02-19 MED ORDER — METHOCARBAMOL 500 MG PO TABS
500.0000 mg | ORAL_TABLET | Freq: Four times a day (QID) | ORAL | Status: DC | PRN
  Administered 2018-02-19 – 2018-02-20 (×3): 500 mg via ORAL
  Filled 2018-02-19 (×3): qty 1

## 2018-02-19 MED ORDER — POLYETHYLENE GLYCOL 3350 17 G PO PACK: 17.0000 g | PACK | Freq: Every day | ORAL | Status: DC | PRN

## 2018-02-19 MED ORDER — LACTATED RINGERS IV SOLN
INTRAVENOUS | Status: DC | PRN
  Administered 2018-02-19 (×2): via INTRAVENOUS

## 2018-02-19 MED ORDER — HYDROMORPHONE HCL 1 MG/ML IJ SOLN
INTRAMUSCULAR | Status: AC
  Filled 2018-02-19: qty 1

## 2018-02-19 MED ORDER — ALUM & MAG HYDROXIDE-SIMETH 200-200-20 MG/5ML PO SUSP: 30.0000 mL | ORAL | Status: DC | PRN

## 2018-02-19 MED ORDER — SODIUM CHLORIDE 0.9 % IV SOLN
INTRAVENOUS | Status: DC
  Administered 2018-02-20: via INTRAVENOUS

## 2018-02-19 MED ORDER — PROPOFOL 10 MG/ML IV BOLUS
INTRAVENOUS | Status: AC
  Filled 2018-02-19: qty 20

## 2018-02-19 MED ORDER — OXYCODONE HCL ER 10 MG PO T12A
20.0000 mg | EXTENDED_RELEASE_TABLET | Freq: Two times a day (BID) | ORAL | Status: DC
  Administered 2018-02-19 – 2018-02-21 (×5): 20 mg via ORAL
  Filled 2018-02-19 (×5): qty 2

## 2018-02-19 MED ORDER — OXYCODONE-ACETAMINOPHEN 5-325 MG PO TABS: 1.0000 | ORAL_TABLET | Freq: Four times a day (QID) | ORAL | 0 refills | Status: AC | PRN

## 2018-02-19 MED ORDER — LABETALOL HCL 5 MG/ML IV SOLN
INTRAVENOUS | Status: AC
  Filled 2018-02-19: qty 4

## 2018-02-19 MED ORDER — PROPOFOL 10 MG/ML IV BOLUS
INTRAVENOUS | Status: DC | PRN
  Administered 2018-02-19: 100 mg via INTRAVENOUS

## 2018-02-19 MED ORDER — DEXAMETHASONE SODIUM PHOSPHATE 10 MG/ML IJ SOLN
INTRAMUSCULAR | Status: DC | PRN
  Administered 2018-02-19: 10 mg via INTRAVENOUS

## 2018-02-19 MED ORDER — CELECOXIB 200 MG PO CAPS
200.0000 mg | ORAL_CAPSULE | Freq: Two times a day (BID) | ORAL | Status: DC
  Administered 2018-02-19 – 2018-02-21 (×5): 200 mg via ORAL
  Filled 2018-02-19 (×5): qty 1

## 2018-02-19 MED ORDER — MAGNESIUM CITRATE PO SOLN: 1.0000 | Freq: Once | ORAL | Status: DC | PRN

## 2018-02-19 MED ORDER — LABETALOL HCL 5 MG/ML IV SOLN
INTRAVENOUS | Status: DC | PRN
  Administered 2018-02-19: 5 mg via INTRAVENOUS

## 2018-02-19 MED ORDER — MIDAZOLAM HCL 2 MG/2ML IJ SOLN
2.0000 mg | Freq: Once | INTRAMUSCULAR | Status: AC
  Administered 2018-02-19: 2 mg via INTRAVENOUS

## 2018-02-19 MED ORDER — HYDROMORPHONE HCL 1 MG/ML IJ SOLN
INTRAMUSCULAR | Status: AC
  Filled 2018-02-19: qty 0.5

## 2018-02-19 MED ORDER — MIDAZOLAM HCL 2 MG/2ML IJ SOLN
INTRAMUSCULAR | Status: AC
  Filled 2018-02-19: qty 2

## 2018-02-19 MED ORDER — HYDROMORPHONE HCL 1 MG/ML IJ SOLN
0.2500 mg | INTRAMUSCULAR | Status: DC | PRN
  Administered 2018-02-19 (×4): 0.5 mg via INTRAVENOUS

## 2018-02-19 MED ORDER — LIDOCAINE 2% (20 MG/ML) 5 ML SYRINGE
INTRAMUSCULAR | Status: AC
  Filled 2018-02-19: qty 5

## 2018-02-19 MED ORDER — ACETAMINOPHEN 10 MG/ML IV SOLN
INTRAVENOUS | Status: AC
  Filled 2018-02-19: qty 100

## 2018-02-19 MED ORDER — DEXAMETHASONE SODIUM PHOSPHATE 10 MG/ML IJ SOLN: 10.0000 mg | Freq: Two times a day (BID) | INTRAMUSCULAR | Status: DC

## 2018-02-19 MED ORDER — LIDOCAINE 2% (20 MG/ML) 5 ML SYRINGE
INTRAMUSCULAR | Status: DC | PRN
  Administered 2018-02-19: 100 mg via INTRAVENOUS

## 2018-02-19 MED ORDER — METHOCARBAMOL 1000 MG/10ML IJ SOLN
500.0000 mg | Freq: Four times a day (QID) | INTRAVENOUS | Status: DC | PRN
  Filled 2018-02-19: qty 5

## 2018-02-19 MED ORDER — CHLORHEXIDINE GLUCONATE 4 % EX LIQD: 60.0000 mL | Freq: Once | CUTANEOUS | Status: DC

## 2018-02-19 MED ORDER — ONDANSETRON HCL 4 MG/2ML IJ SOLN
INTRAMUSCULAR | Status: DC | PRN
  Administered 2018-02-19: 4 mg via INTRAVENOUS

## 2018-02-19 MED ORDER — ONDANSETRON HCL 4 MG/2ML IJ SOLN: 4.0000 mg | Freq: Four times a day (QID) | INTRAMUSCULAR | Status: DC | PRN

## 2018-02-19 MED ORDER — FENTANYL CITRATE (PF) 100 MCG/2ML IJ SOLN
INTRAMUSCULAR | Status: DC | PRN
  Administered 2018-02-19 (×2): 50 ug via INTRAVENOUS
  Administered 2018-02-19: 100 ug via INTRAVENOUS
  Administered 2018-02-19: 50 ug via INTRAVENOUS
  Administered 2018-02-19: 100 ug via INTRAVENOUS
  Administered 2018-02-19 (×3): 50 ug via INTRAVENOUS

## 2018-02-19 MED ORDER — 0.9 % SODIUM CHLORIDE (POUR BTL) OPTIME
TOPICAL | Status: DC | PRN
  Administered 2018-02-19: 1000 mL

## 2018-02-19 MED ORDER — ASPIRIN EC 325 MG PO TBEC: 325.0000 mg | DELAYED_RELEASE_TABLET | Freq: Two times a day (BID) | ORAL | 0 refills | Status: AC

## 2018-02-19 MED ORDER — HYDROMORPHONE HCL 1 MG/ML IJ SOLN
0.5000 mg | INTRAMUSCULAR | Status: DC | PRN
  Administered 2018-02-19: 1 mg via INTRAVENOUS
  Filled 2018-02-19: qty 1

## 2018-02-19 MED ORDER — DEXAMETHASONE SODIUM PHOSPHATE 10 MG/ML IJ SOLN
INTRAMUSCULAR | Status: AC
  Filled 2018-02-19: qty 1

## 2018-02-19 MED ORDER — BISACODYL 5 MG PO TBEC: 5.0000 mg | DELAYED_RELEASE_TABLET | Freq: Every day | ORAL | Status: DC | PRN

## 2018-02-19 MED ORDER — ROCURONIUM BROMIDE 50 MG/5ML IV SOSY
PREFILLED_SYRINGE | INTRAVENOUS | Status: AC
  Filled 2018-02-19: qty 5

## 2018-02-19 MED ORDER — DOCUSATE SODIUM 100 MG PO CAPS
100.0000 mg | ORAL_CAPSULE | Freq: Two times a day (BID) | ORAL | Status: DC
  Administered 2018-02-19 – 2018-02-21 (×5): 100 mg via ORAL
  Filled 2018-02-19 (×5): qty 1

## 2018-02-19 MED ORDER — ONDANSETRON HCL 4 MG/2ML IJ SOLN
INTRAMUSCULAR | Status: AC
  Filled 2018-02-19: qty 2

## 2018-02-19 MED ORDER — OXYCODONE HCL 5 MG PO TABS
ORAL_TABLET | ORAL | Status: AC
  Filled 2018-02-19: qty 2

## 2018-02-19 MED ORDER — OXYCODONE HCL 5 MG PO TABS
5.0000 mg | ORAL_TABLET | ORAL | Status: DC | PRN
  Administered 2018-02-19 – 2018-02-21 (×5): 10 mg via ORAL
  Filled 2018-02-19 (×5): qty 2

## 2018-02-19 MED ORDER — TRANEXAMIC ACID-NACL 1000-0.7 MG/100ML-% IV SOLN
1000.0000 mg | Freq: Once | INTRAVENOUS | Status: AC
  Administered 2018-02-19: 1000 mg via INTRAVENOUS
  Filled 2018-02-19: qty 100

## 2018-02-19 MED ORDER — GABAPENTIN 400 MG PO CAPS
400.0000 mg | ORAL_CAPSULE | Freq: Four times a day (QID) | ORAL | Status: DC
  Administered 2018-02-19 – 2018-02-21 (×8): 400 mg via ORAL
  Filled 2018-02-19 (×7): qty 1

## 2018-02-19 MED ORDER — DIPHENHYDRAMINE HCL 12.5 MG/5ML PO ELIX: 12.5000 mg | ORAL_SOLUTION | ORAL | Status: DC | PRN

## 2018-02-19 MED ORDER — SUGAMMADEX SODIUM 200 MG/2ML IV SOLN
INTRAVENOUS | Status: DC | PRN
  Administered 2018-02-19: 175 mg via INTRAVENOUS

## 2018-02-19 MED ORDER — BUPIVACAINE HCL (PF) 0.5 % IJ SOLN
INTRAMUSCULAR | Status: AC
  Filled 2018-02-19: qty 30

## 2018-02-19 MED ORDER — HYDROMORPHONE HCL 1 MG/ML IJ SOLN
INTRAMUSCULAR | Status: DC | PRN
  Administered 2018-02-19: 0.5 mg via INTRAVENOUS

## 2018-02-19 MED ORDER — SUGAMMADEX SODIUM 200 MG/2ML IV SOLN
INTRAVENOUS | Status: AC
  Filled 2018-02-19: qty 2

## 2018-02-19 MED ORDER — ROCURONIUM BROMIDE 10 MG/ML (PF) SYRINGE
PREFILLED_SYRINGE | INTRAVENOUS | Status: DC | PRN
  Administered 2018-02-19: 50 mg via INTRAVENOUS

## 2018-02-19 MED ORDER — DIAZEPAM 5 MG PO TABS
5.0000 mg | ORAL_TABLET | Freq: Every day | ORAL | Status: DC
  Administered 2018-02-19 – 2018-02-20 (×2): 5 mg via ORAL
  Filled 2018-02-19 (×2): qty 1

## 2018-02-19 MED ORDER — PHENYLEPHRINE 40 MCG/ML (10ML) SYRINGE FOR IV PUSH (FOR BLOOD PRESSURE SUPPORT)
PREFILLED_SYRINGE | INTRAVENOUS | Status: AC
  Filled 2018-02-19: qty 10

## 2018-02-19 MED ORDER — CEFAZOLIN SODIUM-DEXTROSE 2-4 GM/100ML-% IV SOLN
2.0000 g | Freq: Four times a day (QID) | INTRAVENOUS | Status: AC
  Administered 2018-02-19 (×2): 2 g via INTRAVENOUS
  Filled 2018-02-19 (×2): qty 100

## 2018-02-19 MED ORDER — HYDROMORPHONE HCL 1 MG/ML IJ SOLN
0.5000 mg | INTRAMUSCULAR | Status: DC | PRN
  Administered 2018-02-19 (×3): 0.5 mg via INTRAVENOUS

## 2018-02-19 MED ORDER — BUPIVACAINE HCL 0.5 % IJ SOLN
INTRAMUSCULAR | Status: DC | PRN
  Administered 2018-02-19: 30 mL

## 2018-02-19 MED ORDER — ASPIRIN EC 325 MG PO TBEC
325.0000 mg | DELAYED_RELEASE_TABLET | Freq: Two times a day (BID) | ORAL | Status: DC
  Administered 2018-02-19 – 2018-02-21 (×4): 325 mg via ORAL
  Filled 2018-02-19 (×3): qty 1

## 2018-02-19 MED ORDER — DEXAMETHASONE SODIUM PHOSPHATE 10 MG/ML IJ SOLN
10.0000 mg | Freq: Two times a day (BID) | INTRAMUSCULAR | Status: AC
  Administered 2018-02-19 – 2018-02-20 (×3): 10 mg via INTRAVENOUS
  Filled 2018-02-19 (×3): qty 1

## 2018-02-19 MED ORDER — ONDANSETRON HCL 4 MG PO TABS: 4.0000 mg | ORAL_TABLET | Freq: Four times a day (QID) | ORAL | Status: DC | PRN

## 2018-02-19 MED ORDER — ACETAMINOPHEN 325 MG PO TABS: 325.0000 mg | ORAL_TABLET | Freq: Four times a day (QID) | ORAL | Status: DC | PRN

## 2018-02-19 MED ORDER — BUPIVACAINE LIPOSOME 1.3 % IJ SUSP
INTRAMUSCULAR | Status: DC | PRN
  Administered 2018-02-19: 10 mL

## 2018-02-19 MED ORDER — CEFAZOLIN SODIUM-DEXTROSE 2-4 GM/100ML-% IV SOLN
2.0000 g | INTRAVENOUS | Status: AC
  Administered 2018-02-19: 2 g via INTRAVENOUS
  Filled 2018-02-19: qty 100

## 2018-02-19 MED ORDER — ACETAMINOPHEN 10 MG/ML IV SOLN
INTRAVENOUS | Status: DC | PRN
  Administered 2018-02-19: 1000 mg via INTRAVENOUS

## 2018-02-19 SURGICAL SUPPLY — 64 items
APL SKNCLS STERI-STRIP NONHPOA (GAUZE/BANDAGES/DRESSINGS) ×1
BENZOIN TINCTURE PRP APPL 2/3 (GAUZE/BANDAGES/DRESSINGS) ×2 IMPLANT
BLADE CLIPPER SURG (BLADE) IMPLANT
BNDG COHESIVE 6X5 TAN STRL LF (GAUZE/BANDAGES/DRESSINGS) IMPLANT
BNDG GAUZE ELAST 4 BULKY (GAUZE/BANDAGES/DRESSINGS) IMPLANT
CELLS DAT CNTRL 66122 CELL SVR (MISCELLANEOUS) IMPLANT
CLSR STERI-STRIP ANTIMIC 1/2X4 (GAUZE/BANDAGES/DRESSINGS) ×1 IMPLANT
COVER PERINEAL POST (MISCELLANEOUS) ×2 IMPLANT
COVER SURGICAL LIGHT HANDLE (MISCELLANEOUS) ×2 IMPLANT
COVER WAND RF STERILE (DRAPES) ×2 IMPLANT
CUP ACETABULAR GRIPTON 100 52 (Orthopedic Implant) IMPLANT
DRAPE C-ARM 42X72 X-RAY (DRAPES) ×2 IMPLANT
DRAPE STERI IOBAN 125X83 (DRAPES) ×2 IMPLANT
DRAPE U-SHAPE 47X51 STRL (DRAPES) ×4 IMPLANT
DRSG AQUACEL AG ADV 3.5X 6 (GAUZE/BANDAGES/DRESSINGS) ×1 IMPLANT
DRSG AQUACEL AG ADV 3.5X10 (GAUZE/BANDAGES/DRESSINGS) ×2 IMPLANT
DURAPREP 26ML APPLICATOR (WOUND CARE) ×2 IMPLANT
ELECT BLADE 4.0 EZ CLEAN MEGAD (MISCELLANEOUS)
ELECT CAUTERY BLADE 6.4 (BLADE) ×2 IMPLANT
ELECT REM PT RETURN 9FT ADLT (ELECTROSURGICAL) ×2
ELECTRODE BLDE 4.0 EZ CLN MEGD (MISCELLANEOUS) IMPLANT
ELECTRODE REM PT RTRN 9FT ADLT (ELECTROSURGICAL) ×1 IMPLANT
ELIMINATOR HOLE APEX DEPUY (Hips) ×1 IMPLANT
GLOVE BIOGEL PI IND STRL 8 (GLOVE) ×2 IMPLANT
GLOVE BIOGEL PI INDICATOR 8 (GLOVE) ×2
GLOVE ECLIPSE 7.5 STRL STRAW (GLOVE) ×4 IMPLANT
GOWN STRL REUS W/ TWL LRG LVL3 (GOWN DISPOSABLE) ×2 IMPLANT
GOWN STRL REUS W/ TWL XL LVL3 (GOWN DISPOSABLE) ×2 IMPLANT
GOWN STRL REUS W/TWL LRG LVL3 (GOWN DISPOSABLE) ×4
GOWN STRL REUS W/TWL XL LVL3 (GOWN DISPOSABLE) ×4
GRIPTON 100 52 (Orthopedic Implant) ×2 IMPLANT
HEAD CERAMIC DELTA 36 PLUS 1.5 (Hips) ×1 IMPLANT
HOOD PEEL AWAY FACE SHEILD DIS (HOOD) ×4 IMPLANT
KIT BASIN OR (CUSTOM PROCEDURE TRAY) ×2 IMPLANT
KIT TURNOVER KIT B (KITS) ×2 IMPLANT
LINER NEUTRAL 52X36MM PLUS 4 (Liner) ×1 IMPLANT
MANIFOLD NEPTUNE II (INSTRUMENTS) ×2 IMPLANT
NDL SPNL 22GX3.5 QUINCKE BK (NEEDLE) ×1 IMPLANT
NEEDLE SPNL 22GX3.5 QUINCKE BK (NEEDLE) ×2 IMPLANT
NS IRRIG 1000ML POUR BTL (IV SOLUTION) ×2 IMPLANT
PACK TOTAL JOINT (CUSTOM PROCEDURE TRAY) ×2 IMPLANT
PACK UNIVERSAL I (CUSTOM PROCEDURE TRAY) ×2 IMPLANT
PAD ARMBOARD 7.5X6 YLW CONV (MISCELLANEOUS) ×4 IMPLANT
RETRACTOR WND ALEXIS 18 MED (MISCELLANEOUS) IMPLANT
RTRCTR WOUND ALEXIS 18CM MED (MISCELLANEOUS)
RTRCTR WOUND ALEXIS 18CM SML (INSTRUMENTS) ×2
SAVER CELL AAL HAEMONETICS (INSTRUMENTS) ×1 IMPLANT
SAW OSC TIP CART 19.5X105X1.3 (SAW) ×2 IMPLANT
SPONGE LAP 18X18 X RAY DECT (DISPOSABLE) IMPLANT
STAPLER VISISTAT 35W (STAPLE) IMPLANT
STEM CORAIL KA12 (Stem) ×1 IMPLANT
SUT ETHIBOND NAB CT1 #1 30IN (SUTURE) ×4 IMPLANT
SUT MNCRL AB 3-0 PS2 18 (SUTURE) IMPLANT
SUT VIC AB 0 CT1 27 (SUTURE) ×2
SUT VIC AB 0 CT1 27XBRD ANBCTR (SUTURE) ×1 IMPLANT
SUT VIC AB 1 CT1 27 (SUTURE) ×4
SUT VIC AB 1 CT1 27XBRD ANBCTR (SUTURE) ×2 IMPLANT
SUT VIC AB 2-0 CT1 27 (SUTURE) ×2
SUT VIC AB 2-0 CT1 TAPERPNT 27 (SUTURE) ×1 IMPLANT
SYR 50ML LL SCALE MARK (SYRINGE) ×2 IMPLANT
TOWEL OR 17X24 6PK STRL BLUE (TOWEL DISPOSABLE) ×2 IMPLANT
TOWEL OR 17X26 10 PK STRL BLUE (TOWEL DISPOSABLE) ×2 IMPLANT
TRAY CATH 16FR W/PLASTIC CATH (SET/KITS/TRAYS/PACK) IMPLANT
TRAY FOLEY CATH SILVER 16FR (SET/KITS/TRAYS/PACK) IMPLANT

## 2018-02-19 NOTE — Anesthesia Postprocedure Evaluation (Signed)
Anesthesia Post Note  Patient: Kim Skinner  Procedure(s) Performed: TOTAL HIP ARTHROPLASTY ANTERIOR APPROACH (Left )     Patient location during evaluation: PACU Anesthesia Type: Spinal Level of consciousness: awake and alert Pain management: pain level controlled Vital Signs Assessment: post-procedure vital signs reviewed and stable Respiratory status: spontaneous breathing, nonlabored ventilation, respiratory function stable and patient connected to nasal cannula oxygen Cardiovascular status: blood pressure returned to baseline and stable Postop Assessment: no apparent nausea or vomiting Anesthetic complications: no    Last Vitals:  Vitals:   02/19/18 1053 02/19/18 1105  BP:  117/68  Pulse:  67  Resp:  12  Temp:    SpO2: 100% 97%    Last Pain:  Vitals:   02/19/18 1105  TempSrc:   PainSc: Asleep    LLE Motor Response: Purposeful movement;Responds to commands (02/19/18 1105) LLE Sensation: Full sensation (02/19/18 1105)          [place]

## 2018-02-19 NOTE — Brief Op Note (Signed)
02/19/2018  9:08 AM  PATIENT:  Kim Skinner  52 y.o. female  PRE-OPERATIVE DIAGNOSIS:  OSTEOARTHRITIS LEFT HIP  POST-OPERATIVE DIAGNOSIS:  OSTEOARTHRITIS LEFT HIP  PROCEDURE:  Procedure(s): TOTAL HIP ARTHROPLASTY ANTERIOR APPROACH (Left)  SURGEON:  Surgeon(s) and Role:    Dorna Leitz, MD - Primary  PHYSICIAN ASSISTANT:   ASSISTANTS: bethune   ANESTHESIA:   general  EBL:  300 mL   BLOOD ADMINISTERED:none  DRAINS: none   LOCAL MEDICATIONS USED:  MARCAINE    and OTHER experel  SPECIMEN:  No Specimen  DISPOSITION OF SPECIMEN:  N/A  COUNTS:  YES  TOURNIQUET:  * No tourniquets in log *  DICTATION: .Other Dictation: Dictation Number none given  PLAN OF CARE: Admit to inpatient   PATIENT DISPOSITION:  PACU - hemodynamically stable.   Delay start of Pharmacological VTE agent (>24hrs) due to surgical blood loss or risk of bleeding: no

## 2018-02-19 NOTE — Plan of Care (Signed)
  Problem: Pain Managment: Goal: General experience of comfort will improve Outcome: Progressing   Problem: Safety: Goal: Ability to remain free from injury will improve Outcome: Progressing   

## 2018-02-19 NOTE — Op Note (Signed)
NAME: APRIL, COLTER MEDICAL RECORD [id number] ACCOUNT [id number] DATE OF BIRTH:1965/06/25 FACILITY: [facility] LOCATION: [facility] PHYSICIAN:Christen Bedoya L. Delawrence Fridman, MD  OPERATIVE REPORT  DATE OF PROCEDURE:  02/19/2018  PREOPERATIVE DIAGNOSIS:  End-stage degenerative joint disease, left hip, with bone-on-bone change and lateral subluxation.  POSTOPERATIVE DIAGNOSES:  End-stage degenerative joint disease, left hip, with bone-on-bone change and lateral subluxation.  PROCEDURE: 1. Left total hip replacement with a Corail stem size 12; 52 mm Pinnacle Gription cup, holeless; a +4 neutral liner; and a 36 delta ceramic hip ball +1.5. #2interpretation of multiple intraoperative fluoroscopic images  SURGEON:  Dorna Leitz, MD  ASSISTANT:  Gaspar Skeeters, PA-C  ANESTHESIA:  General.  BRIEF HISTORY:  The patient is a 52 year old female with a long history of significant complaints of left hip pain.  She has been treated conservatively for a prolonged period of time.  After failure of all conservative care, she was taken to the  operating room for left total hip replacement.  She had x-rays showing severe bone-on-bone change with a superior and lateral migration of the femur and under assistance of [employer], who was present for the entire case and assisted with  retraction and closing to minimize OR time.  DESCRIPTION OF PROCEDURE:  The patient was taken to the operating room.  After adequate anesthesia was obtained with a general anesthetic, the patient was placed supine on the operating table.  She was moved onto the [place] bed.  All bony prominences were  well padded.  Attention was then turned to the left hip where after routine prep and drape, an incision was made for an anterior approach to the hip.  Subcutaneous tissue was dissected down to the level of the tensor fascia, which was identified and  divided in line with its fibers.  The vessels were cauterized in the anterior portion of the hip, and the  capsule was identified and opened and tagged.  Following that, the head was removed after provisional neck cut was made.  Attention was then turned  towards placing retractors above and below the acetabulum.  The labrectomy was performed followed by sequential reaming of the acetabulum up to a level of 51, and a 52 mm Pinnacle cup was chosen and placed.  Gription was used.  Following this, the +4  neutral liner was placed.  Attention turned to the stem, which was extended, externally rotated, and the cookie cutter was used to open laterally in the femur, followed by rongeur.  We then used rasping of the femur up to a size of 12, which fit quite  nicely.  A +0 ball was used.  Symmetric leg lengths were achieved.  At that point, we checked the 12 rasp.  It was fine.  Twelve was opened and placed.  A 1.5 delta ceramic hip ball was placed and the hip reduced.  Anatomic reduction was achieved as well  as symmetric leg lengths.  At this point, the wound was irrigated, suctioned dry.  We closed the capsule with #1 Vicryl running.  We closed the tensor fascia with #1 Vicryl running, skin with 0 and 2-0 Vicryl and 3-0 Monocryl subcuticular.  Benzoin and  Steri-Strips were applied.  A sterile compressive dressing was applied.  The patient was taken to recovery.  She was noted to be in satisfactory condition.  Estimated blood loss for the procedure was about 400 mL.  The final can be gotten from the  anesthetic record.  Of note, Gaspar Skeeters was present throughout the case  and assisted with retraction and also closing to minimize OR time.  LN/NUANCE  D:02/19/2018 T:02/19/2018 JOB:[id number]

## 2018-02-19 NOTE — Evaluation (Signed)
Physical Therapy Evaluation Patient Details Name: Kim Skinner MRN: [id number] DOB: [birth year] Today's Date: 02/19/2018   History of Present Illness  Pt is a 52 y/o female s/p elective L THA, direct anterior approach. PMH includes anxiety, HTN, and sciatic nerve injury.   Clinical Impression  Pt is s/p surgery above with deficits below. Pt with increased pain which limited gait distance. Required min guard to min A for mobility using RW this session. Anticipate pt will progress well once pain controlled. Will continue to follow acutely to maximize functional mobility independence and safety.     Follow Up Recommendations Follow surgeon's recommendation for DC plan and follow-up therapies;Supervision for mobility/OOB    Equipment Recommendations  Rolling walker with 5" wheels    Recommendations for Other Services       Precautions / Restrictions Precautions Precautions: None Restrictions Weight Bearing Restrictions: Yes LLE Weight Bearing: Weight bearing as tolerated      Mobility  Bed Mobility Overal bed mobility: Needs Assistance Bed Mobility: Supine to Sit;Sit to Supine     Supine to sit: Min assist Sit to supine: Min assist   General bed mobility comments: Min A for LLE assist. Increased time to perform.   Transfers Overall transfer level: Needs assistance Equipment used: Rolling walker (2 wheeled) Transfers: Sit to/from Stand Sit to Stand: Min assist         General transfer comment: Min A for steadying assist. Verbal cues for safe hand placement.   Ambulation/Gait Ambulation/Gait assistance: Min guard Gait Distance (Feet): 50 Feet Assistive device: Rolling walker (2 wheeled) Gait Pattern/deviations: Step-to pattern;Decreased step length - right;Decreased step length - left;Decreased weight shift to left;Antalgic Gait velocity: Decreased    General Gait Details: Slow, antalgic gait. Verbal cues for upright posture and proximity to device. Distance  limited secondary to pain.   Stairs            Wheelchair Mobility    Modified Rankin (Stroke Patients Only)       Balance Overall balance assessment: Needs assistance Sitting-balance support: No upper extremity supported;Feet supported Sitting balance-Leahy Scale: Fair     Standing balance support: Bilateral upper extremity supported;During functional activity Standing balance-Leahy Scale: Poor Standing balance comment: Reliant on BUE support.                              Pertinent Vitals/Pain Pain Assessment: Faces Faces Pain Scale: Hurts whole lot Pain Location: L hip  Pain Descriptors / Indicators: Aching;Operative site guarding;Burning Pain Intervention(s): Limited activity within patient's tolerance;Monitored during session;Repositioned    Home Living Family/patient expects to be discharged to:: Private residence Living Arrangements: Spouse/significant other Available Help at Discharge: Family Type of Home: House Home Access: Ramped entrance     Home Layout: Two level Home Equipment: Bedside commode      Prior Function Level of Independence: Independent               Hand Dominance        Extremity/Trunk Assessment   Upper Extremity Assessment Upper Extremity Assessment: Overall WFL for tasks assessed    Lower Extremity Assessment Lower Extremity Assessment: LLE deficits/detail LLE Deficits / Details: Deficits consistent with post op pain and weakness.     Cervical / Trunk Assessment Cervical / Trunk Assessment: Normal  Communication   Communication: No difficulties  Cognition Arousal/Alertness: Awake/alert Behavior During Therapy: WFL for tasks assessed/performed Overall Cognitive Status: Within Functional Limits for tasks assessed  General Comments      Exercises Total Joint Exercises Quad Sets: AROM;Left;10 reps   Assessment/Plan    PT Assessment Patient  needs continued PT services  PT Problem List Decreased strength;Decreased range of motion;Decreased activity tolerance;Decreased balance;Decreased mobility;Decreased knowledge of use of DME;Pain       PT Treatment Interventions Gait training;DME instruction;Stair training;Functional mobility training;Therapeutic exercise;Therapeutic activities;Balance training;Patient/family education    PT Goals (Current goals can be found in the Care Plan section)  Acute Rehab PT Goals Patient Stated Goal: to get better so I can take care of my daughter PT Goal Formulation: With patient Time For Goal Achievement: 03/05/18 Potential to Achieve Goals: Good    Frequency 7X/week   Barriers to discharge        Co-evaluation               AM-PAC PT "6 Clicks" Mobility  Outcome Measure Help needed turning from your back to your side while in a flat bed without using bedrails?: A Little Help needed moving from lying on your back to sitting on the side of a flat bed without using bedrails?: A Little Help needed moving to and from a bed to a chair (including a wheelchair)?: A Little Help needed standing up from a chair using your arms (e.g., wheelchair or bedside chair)?: A Little Help needed to walk in hospital room?: A Little Help needed climbing 3-5 steps with a railing? : A Lot 6 Click Score: 17    End of Session Equipment Utilized During Treatment: Gait belt Activity Tolerance: Patient limited by pain Patient left: in bed;with call bell/phone within reach Nurse Communication: Mobility status;Patient requests pain meds PT Visit Diagnosis: Other abnormalities of gait and mobility (R26.89);Pain Pain - Right/Left: Left Pain - part of body: Hip    Time: 3338-3291 PT Time Calculation (min) (ACUTE ONLY): 36 min   Charges:   PT Evaluation $PT Eval Low Complexity: 1 Low PT Treatments $Gait Training: 8-22 mins        Leighton Ruff, PT, DPT  Acute Rehabilitation Services  Pager:  [phone] Office: [phone]   Rudean Hitt 02/19/2018, 3:23 PM

## 2018-02-19 NOTE — Progress Notes (Signed)
Pt arrived to room 5N14 after surgery. Received report from Capron, RN in PACU. See assessment. Will continue to monitor.

## 2018-02-19 NOTE — Anesthesia Procedure Notes (Signed)
Procedure Name: Intubation Date/Time: 02/19/2018 7:40 AM Performed by: Candis Shine, CRNA Pre-anesthesia Checklist: Patient identified, Emergency Drugs available, Suction available and Patient being monitored Patient Re-evaluated:Patient Re-evaluated prior to induction Oxygen Delivery Method: Circle System Utilized Preoxygenation: Pre-oxygenation with 100% oxygen Induction Type: IV induction Ventilation: Mask ventilation without difficulty and Oral airway inserted - appropriate to patient size Laryngoscope Size: Sabra Heck and 2 Grade View: Grade I Tube type: Oral Tube size: 7.0 mm Number of attempts: 1 Airway Equipment and Method: Stylet and Oral airway Placement Confirmation: ETT inserted through vocal cords under direct vision,  positive ETCO2 and breath sounds checked- equal and bilateral Secured at: 19 cm Tube secured with: Tape Dental Injury: Teeth and Oropharynx as per pre-operative assessment

## 2018-02-19 NOTE — Transfer of Care (Signed)
Immediate Anesthesia Transfer of Care Note  Patient: Kim Skinner  Procedure(s) Performed: TOTAL HIP ARTHROPLASTY ANTERIOR APPROACH (Left )  Patient Location: PACU  Anesthesia Type:General  Level of Consciousness: awake, alert  and oriented  Airway & Oxygen Therapy: Patient Spontanous Breathing and Patient connected to nasal cannula oxygen  Post-op Assessment: Report given to RN and Post -op Vital signs reviewed and stable  Post vital signs: Reviewed and stable  Last Vitals:  Vitals Value Taken Time  BP 150/84 02/19/2018  9:39 AM  Temp    Pulse 73 02/19/2018  9:41 AM  Resp 11 02/19/2018  9:41 AM  SpO2 100 % 02/19/2018  9:41 AM  Vitals shown include unvalidated device data.  Last Pain:  Vitals:   02/19/18 0616  TempSrc:   PainSc: 10-Worst pain ever      Patients Stated Pain Goal: 3 (32/91/91 6606)  Complications: No apparent anesthesia complications

## 2018-02-19 NOTE — Discharge Instructions (Signed)

## 2018-02-20 ENCOUNTER — Encounter (HOSPITAL_COMMUNITY): Payer: Self-pay | Admitting: Orthopedic Surgery

## 2018-02-20 LAB — CBC
HCT: 28.6 % — ABNORMAL LOW (ref 36.0–46.0)
Hemoglobin: 8.6 g/dL — ABNORMAL LOW (ref 12.0–15.0)
MCH: 24.5 pg — ABNORMAL LOW (ref 26.0–34.0)
MCHC: 30.1 g/dL (ref 30.0–36.0)
MCV: 81.5 fL (ref 80.0–100.0)
Platelets: 260 10*3/uL (ref 150–400)
RBC: 3.51 MIL/uL — ABNORMAL LOW (ref 3.87–5.11)
RDW: 14.9 % (ref 11.5–15.5)
WBC: 8.6 10*3/uL (ref 4.0–10.5)
nRBC: 0 % (ref 0.0–0.2)

## 2018-02-20 NOTE — Progress Notes (Signed)
Physical Therapy Treatment Patient Details Name: Kim Skinner MRN: [id number] DOB: [birth year] Today's Date: 02/20/2018    History of Present Illness Pt is a 52 y/o female s/p elective L THA, direct anterior approach. PMH includes anxiety, HTN, and sciatic nerve injury.     PT Comments    Pt has reached most  Mod. Independent goals for mobility. Pt reports she is scheduled for d/c home tomorrow. Pt has been educated on HEP and stair training. Pt is mod Independent with gait on the unit, but requires +1 for IV pole management. Once IV is d/c then pt is cleared to walk in the halls until d/c home. Pt will continue to benefit from reviewing HEP and focusing on decreasing antalgic gait with therapist instruction until d/c home.  Follow Up Recommendations  Follow surgeon's recommendation for DC plan and follow-up therapies;Supervision for mobility/OOB     Equipment Recommendations  Rolling walker with 5" wheels    Recommendations for Other Services       Precautions / Restrictions Precautions Precautions: None Restrictions LLE Weight Bearing: Weight bearing as tolerated    Mobility  Bed Mobility Overal bed mobility: Modified Independent Bed Mobility: Supine to Sit     Supine to sit: Modified independent (Device/Increase time) Sit to supine: Modified independent (Device/Increase time)   General bed mobility comments: with cues pt was able to sit edge of bed without assistance from therapist  Transfers Overall transfer level: Modified independent Equipment used: Rolling walker (2 wheeled) Transfers: Sit to/from Stand Sit to Stand: Modified independent (Device/Increase time)            Ambulation/Gait Ambulation/Gait assistance: Modified independent (Device/Increase time) Gait Distance (Feet): 400 Feet Assistive device: Rolling walker (2 wheeled) Gait Pattern/deviations: Step-through pattern;Antalgic;Decreased weight shift to left Gait velocity: Decreased         Stairs Stairs: Yes Stairs assistance: Supervision Stair Management: Two rails;Step to pattern;Forwards Number of Stairs: 3     Wheelchair Mobility    Modified Rankin (Stroke Patients Only)       Balance Overall balance assessment: Needs assistance Sitting-balance support: No upper extremity supported Sitting balance-Leahy Scale: Good     Standing balance support: Bilateral upper extremity supported Standing balance-Leahy Scale: Fair                              Cognition Arousal/Alertness: Awake/alert Behavior During Therapy: WFL for tasks assessed/performed Overall Cognitive Status: Within Functional Limits for tasks assessed                                        Exercises Total Joint Exercises Ankle Circles/Pumps: AROM;Strengthening;Both;Supine;15 reps Quad Sets: AROM;Strengthening;Both;15 reps;Supine Heel Slides: AROM;Strengthening;Left;Supine;15 reps Hip ABduction/ADduction: AROM;Strengthening;Left;Supine;15 reps Long Arc Quad: AROM;Strengthening;Left;Seated;15 reps    General Comments        Pertinent Vitals/Pain Pain Assessment: 0-10 Pain Score: 3  Pain Location: L hip  Pain Descriptors / Indicators: Discomfort Pain Intervention(s): Limited activity within patient's tolerance;Monitored during session;Repositioned;Ice applied    Home Living                      Prior Function            PT Goals (current goals can now be found in the care plan section) Progress towards PT goals: Progressing toward goals    Frequency  7X/week      PT Plan Current plan remains appropriate    Co-evaluation              AM-PAC PT "6 Clicks" Mobility   Outcome Measure  Help needed turning from your back to your side while in a flat bed without using bedrails?: None Help needed moving from lying on your back to sitting on the side of a flat bed without using bedrails?: None Help needed moving to and from a  bed to a chair (including a wheelchair)?: None Help needed standing up from a chair using your arms (e.g., wheelchair or bedside chair)?: None Help needed to walk in hospital room?: A Little(due to equipment, IV pole) Help needed climbing 3-5 steps with a railing? : A Little 6 Click Score: 22    End of Session   Activity Tolerance: Patient tolerated treatment well Patient left: in bed;with call bell/phone within reach Nurse Communication: Mobility status(pt is safe to ambulate in room  with RW when IV is discontinued) PT Visit Diagnosis: Other abnormalities of gait and mobility (R26.89);Pain Pain - Right/Left: Left Pain - part of body: Hip     Time: 2836-6294 PT Time Calculation (min) (ACUTE ONLY): 15 min  Charges:  $Gait Training: 8-22 mins $Therapeutic Exercise: 8-22 mins                    Theodoro Grist, PT   Lelon Mast 02/20/2018, 11:42 AM

## 2018-02-20 NOTE — Progress Notes (Signed)
Subjective: 1 Day Post-Op Procedure(s) (LRB): TOTAL HIP ARTHROPLASTY ANTERIOR APPROACH (Left) Patient reports pain as moderate.  Taking by mouth and voiding okay.  Has difficult home situation with handicapped family member that needs care.  Objective: Vital signs in last 24 hours: Temp:  [97.3 F (36.3 C)-98.6 F (37 C)] 97.7 F (36.5 C) (11/26 0204) Pulse Rate:  [64-82] 67 (11/26 0204) Resp:  [12-20] 17 (11/26 0204) BP: (95-150)/(55-106) 95/63 (11/26 0204) SpO2:  [92 %-100 %] 95 % (11/26 0204)  Intake/Output from previous day: 11/25 0701 - 11/26 0700 In: 2486.7 [P.O.:460; I.V.:1826.7; IV Piggyback:100] Out: 675 [Urine:375; Blood:300] Intake/Output this shift: No intake/output data recorded.  Recent Labs    02/20/18 0424  HGB 8.6*   Recent Labs    02/20/18 0424  WBC 8.6  RBC 3.51*  HCT 28.6*  PLT 260   No results for input(s): NA, K, CL, CO2, BUN, CREATININE, GLUCOSE, CALCIUM in the last 72 hours. No results for input(s): LABPT, INR in the last 72 hours. Left hip exam: Neurovascular intact Sensation intact distally Intact pulses distally Dorsiflexion/Plantar flexion intact Incision: dressing C/D/I Compartment soft   Assessment/Plan: 1 Day Post-Op Procedure(s) (LRB): TOTAL HIP ARTHROPLASTY ANTERIOR APPROACH (Left)  Plan: Aspirin 325 mg twice daily for DVT prophylaxis along with SCDs. Up with physical therapy weightbearing as tolerated on the left without hip precautions. Plan discharge home tomorrow.    Erlene Senters 02/20/2018, 8:28 AM

## 2018-02-20 NOTE — Progress Notes (Signed)
Physical Therapy Treatment Patient Details Name: Kim Skinner MRN: [id number] DOB: 1966/03/22 Today's Date: 02/20/2018    History of Present Illness Pt is a 52 y/o female s/p elective L THA, direct anterior approach. PMH includes anxiety, HTN, and sciatic nerve injury.     PT Comments    Pt is making steady progress with mobility. Pt completed step training this am. Pt is walking 200+ feet with Supervision. Pt will be ready for d/c home per physicians recommendation. Pt will continue to benefit from skilled PT until d/c home to maximize mobility, strength and independence.  Follow Up Recommendations  Follow surgeon's recommendation for DC plan and follow-up therapies;Supervision for mobility/OOB     Equipment Recommendations  Rolling walker with 5" wheels    Recommendations for Other Services       Precautions / Restrictions Precautions Precautions: None Restrictions LLE Weight Bearing: Weight bearing as tolerated    Mobility  Bed Mobility Overal bed mobility: Needs Assistance Bed Mobility: Supine to Sit     Supine to sit: Supervision Sit to supine: Supervision   General bed mobility comments: with cues pt was able to sit edge of bed without assistance from therapist  Transfers Overall transfer level: Needs assistance Equipment used: Rolling walker (2 wheeled) Transfers: Sit to/from Stand Sit to Stand: Supervision            Ambulation/Gait Ambulation/Gait assistance: Supervision Gait Distance (Feet): 200 Feet Assistive device: Rolling walker (2 wheeled) Gait Pattern/deviations: Step-through pattern;Antalgic;Decreased weight shift to left Gait velocity: Decreased        Stairs Stairs: Yes Stairs assistance: Supervision Stair Management: Two rails;Step to pattern;Forwards Number of Stairs: 3     Wheelchair Mobility    Modified Rankin (Stroke Patients Only)       Balance Overall balance assessment: Needs assistance Sitting-balance  support: No upper extremity supported;Feet supported Sitting balance-Leahy Scale: Good     Standing balance support: Bilateral upper extremity supported Standing balance-Leahy Scale: Fair                              Cognition Arousal/Alertness: Awake/alert Behavior During Therapy: WFL for tasks assessed/performed Overall Cognitive Status: Within Functional Limits for tasks assessed                                        Exercises Total Joint Exercises Ankle Circles/Pumps: AROM;Strengthening;Both;Supine;15 reps Quad Sets: AROM;Strengthening;Both;15 reps;Supine Heel Slides: AROM;Strengthening;Left;Supine;15 reps Hip ABduction/ADduction: AROM;Strengthening;Left;Supine;15 reps Long Arc Quad: AROM;Strengthening;Left;Seated;15 reps    General Comments        Pertinent Vitals/Pain Pain Assessment: 0-10 Pain Score: 3  Pain Location: L hip  Pain Descriptors / Indicators: Discomfort Pain Intervention(s): Limited activity within patient's tolerance;Repositioned;Ice applied;Monitored during session    Home Living                      Prior Function            PT Goals (current goals can now be found in the care plan section) Progress towards PT goals: Progressing toward goals    Frequency    7X/week      PT Plan Current plan remains appropriate    Co-evaluation              AM-PAC PT "6 Clicks" Mobility   Outcome Measure  Help needed turning from  your back to your side while in a flat bed without using bedrails?: None Help needed moving from lying on your back to sitting on the side of a flat bed without using bedrails?: None Help needed moving to and from a bed to a chair (including a wheelchair)?: A Little Help needed standing up from a chair using your arms (e.g., wheelchair or bedside chair)?: A Little Help needed to walk in hospital room?: A Little Help needed climbing 3-5 steps with a railing? : A Little 6 Click  Score: 20    End of Session   Activity Tolerance: Patient tolerated treatment well Patient left: in bed;with call bell/phone within reach Nurse Communication: Mobility status PT Visit Diagnosis: Other abnormalities of gait and mobility (R26.89);Pain Pain - Right/Left: Left Pain - part of body: Hip     Time: 2841-3244 PT Time Calculation (min) (ACUTE ONLY): 23 min  Charges:  $Gait Training: 8-22 mins $Therapeutic Exercise: 8-22 mins                     Theodoro Grist, PT   Lelon Mast 02/20/2018, 10:26 AM

## 2018-02-20 NOTE — Care Management Note (Signed)
Case Management Note  Patient Details  Name: Kim Skinner MRN: [id number] Date of Birth: Jan 23, 1966  Subjective/Objective:  52 yr old female s/p left total hip arthroplasty.                  Action/Plan: Case manager spoke with patient concerning discharge plan and DME. Choice for Home Health agency offered, referral called to Neoma Laming, [place] Liaison. Patient will have family support at discharge.    Expected Discharge Date:   02/21/18               Expected Discharge Plan:  [place]  In-House Referral:  NA  Discharge planning Services  CM Consult  Post Acute Care Choice:  Durable Medical Equipment, Home Health Choice offered to:  Patient  DME Arranged:  Walker rolling DME Agency:  [place]:  PT [facility] Agency:  [place]  Status of Service:  Completed, signed off  If discussed at [place] of Stay Meetings, dates discussed:    Additional Comments:  Ninfa Meeker, RN 02/20/2018, 1:34 PM

## 2018-02-21 DIAGNOSIS — D62 Acute posthemorrhagic anemia: Secondary | ICD-10-CM

## 2018-02-21 LAB — CBC
HCT: 27.3 % — ABNORMAL LOW (ref 36.0–46.0)
Hemoglobin: 8.4 g/dL — ABNORMAL LOW (ref 12.0–15.0)
MCH: 25 pg — ABNORMAL LOW (ref 26.0–34.0)
MCHC: 30.8 g/dL (ref 30.0–36.0)
MCV: 81.3 fL (ref 80.0–100.0)
Platelets: 301 10*3/uL (ref 150–400)
RBC: 3.36 MIL/uL — ABNORMAL LOW (ref 3.87–5.11)
RDW: 14.8 % (ref 11.5–15.5)
WBC: 11.3 10*3/uL — ABNORMAL HIGH (ref 4.0–10.5)
nRBC: 0 % (ref 0.0–0.2)

## 2018-02-21 NOTE — Progress Notes (Signed)
Patient discharging home. Discharge instructions explained to patient and she verbalized understanding. Took all personal belongings. No further questions or concerns voiced.  
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
## 2018-02-21 NOTE — Progress Notes (Signed)
Patient has DME, bedside RN stated he called for [facility] orders on patient, handoff states he has been set up with [facility].

## 2018-02-21 NOTE — Progress Notes (Signed)
Subjective: 2 Days Post-Op Procedure(s) (LRB): TOTAL HIP ARTHROPLASTY ANTERIOR APPROACH (Left) Patient reports pain as moderate.  Taking by mouth and voiding okay.  No dizziness.  Patient to be discharged today.  Objective: Vital signs in last 24 hours: Temp:  [98.1 F (36.7 C)-98.2 F (36.8 C)] 98.2 F (36.8 C) (11/26 1940) Pulse Rate:  [67-85] 79 (11/27 0544) Resp:  [18-20] 18 (11/27 0544) BP: (95-132)/(63-87) 132/85 (11/27 0544) SpO2:  [100 %] 100 % (11/27 0544)  Intake/Output from previous day: 11/26 0701 - 11/27 0700 In: 2049.5 [P.O.:1080; I.V.:969.5] Out: -  Intake/Output this shift: No intake/output data recorded.  Recent Labs    02/20/18 0424 02/21/18 0249  HGB 8.6* 8.4*   Recent Labs    02/20/18 0424 02/21/18 0249  WBC 8.6 11.3*  RBC 3.51* 3.36*  HCT 28.6* 27.3*  PLT 260 301   No results for input(s): NA, K, CL, CO2, BUN, CREATININE, GLUCOSE, CALCIUM in the last 72 hours. No results for input(s): LABPT, INR in the last 72 hours. Hip exam: Neurovascular intact Sensation intact distally Intact pulses distally Dorsiflexion/Plantar flexion intact Incision: dressing C/D/I No cellulitis present Compartment soft  Assessment/Plan: 2 Days Post-Op Procedure(s) (LRB): TOTAL HIP ARTHROPLASTY ANTERIOR APPROACH (Left)  Acute blood loss anemia, asymptomatic.  Expected. Plan: Discharge home.  Will need home health physical therapy. Aspirin 325 mg twice daily for DVT prophylaxis x1 month postop. Recheck with Dr. Berenice Primas in 2 weeks.    Kim Skinner 02/21/2018, 8:45 AM

## 2018-02-21 NOTE — Progress Notes (Signed)
Physical Therapy Treatment Patient Details Name: Kim Skinner MRN: [id number] DOB: 09-Jun-1965 Today's Date: 02/21/2018    History of Present Illness Pt is a 52 y/o female s/p elective L THA, direct anterior approach. PMH includes anxiety, HTN, and sciatic nerve injury.     PT Comments    Patient is making good progress with PT.  From a mobility standpoint anticipate patient will be ready for DC home when medically ready.    Follow Up Recommendations  Follow surgeon's recommendation for DC plan and follow-up therapies;Supervision for mobility/OOB     Equipment Recommendations  Rolling walker with 5" wheels    Recommendations for Other Services       Precautions / Restrictions Precautions Precautions: None Restrictions Weight Bearing Restrictions: Yes LLE Weight Bearing: Weight bearing as tolerated    Mobility  Bed Mobility Overal bed mobility: Modified Independent Bed Mobility: Supine to Sit     Supine to sit: Modified independent (Device/Increase time) Sit to supine: Modified independent (Device/Increase time)   General bed mobility comments: increased time and effort; pt assists L LE with R LE   Transfers Overall transfer level: Modified independent Equipment used: Rolling walker (2 wheeled) Transfers: Sit to/from Stand              Ambulation/Gait Ambulation/Gait assistance: Modified independent (Device/Increase time);Supervision Gait Distance (Feet): 400 Feet Assistive device: Rolling walker (2 wheeled);Straight cane Gait Pattern/deviations: Step-through pattern;Antalgic;Decreased weight shift to left Gait velocity: Decreased    General Gait Details: mod I with RW and supervision for safety with use of SPC; most of distance using SPC with cues for sequencing and safety; slight antalgic gait using SPC, but remains steady    Stairs   Stairs assistance: Supervision Stair Management: Step to pattern;Forwards;No rails;Backwards;With cane   General  stair comments: stairs practiced using SPC and RW; carry over of sequencing demonstrated   Wheelchair Mobility    Modified Rankin (Stroke Patients Only)       Balance Overall balance assessment: Needs assistance Sitting-balance support: No upper extremity supported Sitting balance-Leahy Scale: Good     Standing balance support: No upper extremity supported;During functional activity Standing balance-Leahy Scale: Fair                              Cognition Arousal/Alertness: Awake/alert Behavior During Therapy: WFL for tasks assessed/performed Overall Cognitive Status: Within Functional Limits for tasks assessed                                        Exercises Total Joint Exercises Hip ABduction/ADduction: AROM;Strengthening;Left;10 reps;Standing Knee Flexion: AROM;Left;10 reps;Standing Marching in Standing: AROM;Both;10 reps;Standing Standing Hip Extension: AROM;Left;10 reps;Standing    General Comments        Pertinent Vitals/Pain Pain Assessment: Faces Faces Pain Scale: Hurts a little bit Pain Location: L hip  Pain Descriptors / Indicators: Discomfort Pain Intervention(s): Monitored during session;Premedicated before session    Home Living                      Prior Function            PT Goals (current goals can now be found in the care plan section) Acute Rehab PT Goals Patient Stated Goal: to get better so I can take care of my daughter Progress towards PT goals: Progressing toward goals  Frequency    7X/week      PT Plan Current plan remains appropriate    Co-evaluation              AM-PAC PT "6 Clicks" Mobility   Outcome Measure  Help needed turning from your back to your side while in a flat bed without using bedrails?: None Help needed moving from lying on your back to sitting on the side of a flat bed without using bedrails?: None Help needed moving to and from a bed to a chair (including a  wheelchair)?: None Help needed standing up from a chair using your arms (e.g., wheelchair or bedside chair)?: None Help needed to walk in hospital room?: None Help needed climbing 3-5 steps with a railing? : A Little 6 Click Score: 23    End of Session Equipment Utilized During Treatment: Gait belt Activity Tolerance: Patient tolerated treatment well Patient left: in bed;with call bell/phone within reach;with SCD's reapplied Nurse Communication: Mobility status PT Visit Diagnosis: Other abnormalities of gait and mobility (R26.89);Pain Pain - Right/Left: Left Pain - part of body: Hip     Time: 8341-9622 PT Time Calculation (min) (ACUTE ONLY): 35 min  Charges:  $Gait Training: 8-22 mins $Therapeutic Exercise: 8-22 mins                     Earney Navy, PTA Acute Rehabilitation Services Pager: [phone] Office: [phone]     Darliss Cheney 02/21/2018, 11:25 AM

## 2018-03-08 ENCOUNTER — Ambulatory Visit: Payer: Medicaid Other | Attending: Orthopedic Surgery | Admitting: Physical Therapy

## 2018-04-04 ENCOUNTER — Other Ambulatory Visit: Payer: Self-pay

## 2018-04-04 ENCOUNTER — Encounter: Payer: Self-pay | Admitting: Physical Therapy

## 2018-04-04 ENCOUNTER — Ambulatory Visit: Payer: Medicaid Other | Attending: Orthopedic Surgery | Admitting: Physical Therapy

## 2018-04-04 DIAGNOSIS — M6281 Muscle weakness (generalized): Secondary | ICD-10-CM | POA: Insufficient documentation

## 2018-04-04 DIAGNOSIS — R262 Difficulty in walking, not elsewhere classified: Secondary | ICD-10-CM | POA: Diagnosis present

## 2018-04-04 DIAGNOSIS — M25552 Pain in left hip: Secondary | ICD-10-CM | POA: Diagnosis present
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
## 2018-04-06 ENCOUNTER — Ambulatory Visit: Payer: Medicaid Other | Admitting: Physical Therapy

## 2018-04-17 ENCOUNTER — Ambulatory Visit: Payer: Medicaid Other | Admitting: Physical Therapy

## 2018-04-17 ENCOUNTER — Telehealth: Payer: Self-pay | Admitting: Physical Therapy

## 2018-04-17 NOTE — Telephone Encounter (Signed)
Left message on voicemail about missed visit.  Date and time of next visit given.  Our phone number given in case patient no longer needs PT or is unable to attend. Patient was asked to refer to out policy for missed visits. Melvenia Needles PTA

## 2018-04-19 ENCOUNTER — Encounter: Payer: Medicaid Other | Admitting: Physical Therapy

## 2018-04-19 ENCOUNTER — Encounter: Payer: Self-pay | Admitting: Physical Therapy

## 2018-04-19 ENCOUNTER — Ambulatory Visit: Payer: Medicaid Other | Admitting: Physical Therapy

## 2018-04-19 DIAGNOSIS — M25552 Pain in left hip: Secondary | ICD-10-CM | POA: Diagnosis not present

## 2018-04-19 DIAGNOSIS — R262 Difficulty in walking, not elsewhere classified: Secondary | ICD-10-CM

## 2018-04-19 DIAGNOSIS — M6281 Muscle weakness (generalized): Secondary | ICD-10-CM

## 2018-04-19 NOTE — Therapy (Signed)
[place], [place], [address] Phone: [phone]   Fax:  [phone]  Physical Therapy Treatment  Patient Details  Name: Kim Skinner MRN: [id number] Date of Birth: 23-Dec-1965 Referring Provider (PT): Dorna Leitz, MD   Encounter Date: 04/19/2018  PT End of Session - 04/19/18 1239    Visit Number  2    Number of Visits  4    Date for PT Re-Evaluation  05/02/18    Authorization Type  Medicaid    PT Start Time  1147    PT Stop Time  1226    PT Time Calculation (min)  39 min    Activity Tolerance  Patient tolerated treatment well    Behavior During Therapy  [facility] for tasks assessed/performed       Past Medical History:  Diagnosis Date  . Anxiety   . Arthritis   . Dental caries   . Depression   . Electrocution   . Endometriosis   . GERD (gastroesophageal reflux disease)   . Hypertension   . Restless leg syndrome   . Sciatic nerve injury   . Wears dentures   . Wears glasses     Past Surgical History:  Procedure Laterality Date  . CHOLECYSTECTOMY N/A 05/11/2012   Procedure: LAPAROSCOPIC CHOLECYSTECTOMY WITH INTRAOPERATIVE CHOLANGIOGRAM;  Surgeon: Shann Medal, MD;  Location: [place];  Service: General;  Laterality: N/A;  . MULTIPLE EXTRACTIONS WITH ALVEOLOPLASTY N/A 05/26/2017   Procedure: MULTIPLE EXTRACTION WITH ALVEOLOPLASTY;  Surgeon: Diona Browner, DDS;  Location: [place];  Service: Oral Surgery;  Laterality: N/A;  . MULTIPLE TOOTH EXTRACTIONS    . TOTAL HIP ARTHROPLASTY Left 02/19/2018   Procedure: TOTAL HIP ARTHROPLASTY ANTERIOR APPROACH;  Surgeon: Dorna Leitz, MD;  Location: [place];  Service: Orthopedics;  Laterality: Left;  . TUBAL LIGATION      There were no vitals filed for this visit.  Subjective Assessment - 04/19/18 1148    Subjective  Pt. worked into schedule with cancelled visit due to inability to attend session this PM. Pt. c/o some swelling in left knee otherwise no new  complaints/concerns s/p THA.    Currently in Pain?  Yes    Pain Score  3     Pain Location  Back    Pain Orientation  Lower    Pain Descriptors / Indicators  Sharp;Shooting    Pain Onset  More than a month ago    Pain Frequency  Constant    Aggravating Factors   activity, movement    Pain Relieving Factors  medication, rest    Effect of Pain on Daily Activities  sleep disturbance    Multiple Pain Sites  Yes    Pain Score  6    Pain Location  Knee    Pain Orientation  Left    Pain Descriptors / Indicators  Sharp   "stiff"   Pain Type  Chronic pain    Pain Onset  More than a month ago    Pain Frequency  Intermittent    Aggravating Factors   movement, activity    Pain Relieving Factors  rest, medication    Effect of Pain on Daily Activities  limits walking/mobility tolerance                       [facility] Adult PT Treatment/Exercise - 04/19/18 0001      Exercises   Exercises  Knee/Hip      Knee/Hip Exercises:  Aerobic   Nustep  L4 x 5 min      Knee/Hip Exercises: Standing   Hip Flexion  Both;1 set;15 reps    Hip Flexion Limitations  Red Theraband, short arc ROM    Forward Lunges  Both;10 reps    Forward Lunges Limitations  partial lunge    Hip Abduction  Stengthening;Both;2 sets;10 reps    Abduction Limitations  red Theraband    Lateral Step Up  15 reps;Step Height: 6"    Forward Step Up  Left;1 set;15 reps;Hand Hold: 1;Step Height: 6"    Forward Step Up Limitations  with opp hip hike    Functional Squat  2 sets;10 reps    Functional Squat Limitations  at parallel bars with bilat. UE support    SLS  L SLS on Airx 5 x 10 sec holds    Other Standing Knee Exercises  wooden wobble board fw/rev, side to side x 1.5 min ea. for balance    Other Standing Knee Exercises  Romber EC 20 sec x 3 feet together on Airex      Knee/Hip Exercises: Seated   Long Arc Quad  Left;20 reps    Long Arc Quad Weight  4 lbs.    Clamshell with TheraBand  Green   x20 reps   Hamstring  Curl  Left;2 sets;10 reps    Hamstring Limitations  Green Theraband      Knee/Hip Exercises: Supine   Bridges with Ball Squeeze  Both;20 reps    Other Supine Knee/Hip Exercises  clamshell green band 2x10    Other Supine Knee/Hip Exercises  DKTC with legs on 55 cm P-ball      Manual Therapy   Manual Therapy  Passive ROM    Passive ROM  left hip flexion emphasis             PT Education - 04/19/18 1238    Education Details  POC, gym exercises-avoid high impact activities, ROM involving excessive hip ext/abd/ER    Person(s) Educated  Patient    Methods  Explanation;Demonstration;Verbal cues    Comprehension  Verbalized understanding;Returned demonstration          PT Long Term Goals - 04/04/18 2013      PT LONG TERM GOAL #1   Title  Independent with HEP    Baseline  no HEP    Time  4    Period  Weeks    Status  New    Target Date  05/02/18      PT LONG TERM GOAL #2   Title  Ascend/descend 1 flight of stairs with reciprocal gait to improve community access with mobility    Baseline  unable, needs "single step" pattern    Time  4    Period  Weeks    Status  New    Target Date  05/02/18      PT LONG TERM GOAL #3   Title  Increase left hip flexion AROM 10 deg or greater to improve ability to donn shoes and perform transfers from low seats    Baseline  100 deg    Time  4    Period  Weeks    Status  New    Target Date  05/02/18            Plan - 04/19/18 1239    Clinical Impression Statement  Pt. did well with first PT tx. session with progression of closed-chain strengthening and work on balance/proprioceptive challenges. Knee pain  more than hip pain on left side but knee pain did not significant impact tolerance of therapy session and pt. noted knee felt good/less swollen post-tx.    Clinical Presentation  Stable    Clinical Decision Making  Low    Rehab Potential  Good    PT Frequency  --   eval + 3 tx. visits   PT Duration  4 weeks    PT  Treatment/Interventions  ADLs/Self Care Home Management;Cryotherapy;Moist Heat;Electrical Stimulation;Gait training;Stair training;Neuromuscular re-education;Functional mobility training;Therapeutic exercise;Therapeutic activities;Patient/family education;Manual techniques;Dry needling;Passive range of motion;Taping    PT Next Visit Plan  NUSTEP warm up, partial squats, step ups, review lifting mechanics to assist daughter, standing hip abd and resisted march/knee flexed, seated + mat-based hip and knee strengthening    PT Home Exercise Plan  partial squats, left hip short arc flexion/march in standing, hip bridge, clamshell, hip add. isometric    Consulted and Agree with Plan of Care  Patient       Patient will benefit from skilled therapeutic intervention in order to improve the following deficits and impairments:  Impaired flexibility, Difficulty walking, Decreased balance, Decreased activity tolerance, Decreased endurance, Decreased range of motion, Decreased strength, Pain  Visit Diagnosis: Pain in left hip  Difficulty in walking, not elsewhere classified  Muscle weakness (generalized)     Problem List Patient Active Problem List   Diagnosis Date Noted  . Postoperative anemia due to acute blood loss 02/21/2018  . Primary osteoarthritis of left hip 02/19/2018  . Low back pain 09/11/2012  . Cholecystitis with cholelithiasis 05/11/2012  . S/P laparoscopic cholecystectomy 05/11/2012  . Sciatic nerve injury     Beaulah Dinning, PT, DPT 04/19/18 12:42 PM  [facility] [address] [place], [place], [address] Phone: [phone]   Fax:  [phone]  Name: Kim Skinner MRN: [id number] Date of Birth: November 11, 1965

## 2018-04-25 ENCOUNTER — Ambulatory Visit: Payer: Medicaid Other | Admitting: Physical Therapy

## 2018-04-25 ENCOUNTER — Encounter: Payer: Self-pay | Admitting: Physical Therapy

## 2018-04-25 DIAGNOSIS — R262 Difficulty in walking, not elsewhere classified: Secondary | ICD-10-CM

## 2018-04-25 DIAGNOSIS — M25552 Pain in left hip: Secondary | ICD-10-CM | POA: Diagnosis not present

## 2018-04-25 DIAGNOSIS — M6281 Muscle weakness (generalized): Secondary | ICD-10-CM
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
## 2018-04-30 ENCOUNTER — Ambulatory Visit: Payer: Medicaid Other | Attending: Orthopedic Surgery | Admitting: Physical Therapy

## 2018-04-30 ENCOUNTER — Telehealth: Payer: Self-pay | Admitting: Physical Therapy

## 2018-04-30 NOTE — Telephone Encounter (Signed)
Called and spoke with patient regarding no show for appointment. She reports that she had to take her daughter to the doctor and forgot to call to cancel. Patient will call back to reschedule (informed would need to be with PT in order to request more insurance authorization if needed as visit will be last approved under current authorization).
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
## 2019-01-22 IMAGING — CR DG CHEST 2V
2 series · 2 of 2 positions shown · non-contrast
Comparison: Radiographs May 11, 2012.

CLINICAL DATA: Preop for hip replacement.  Hypertension.

EXAM:
CHEST - 2 VIEW

[w chest pa]
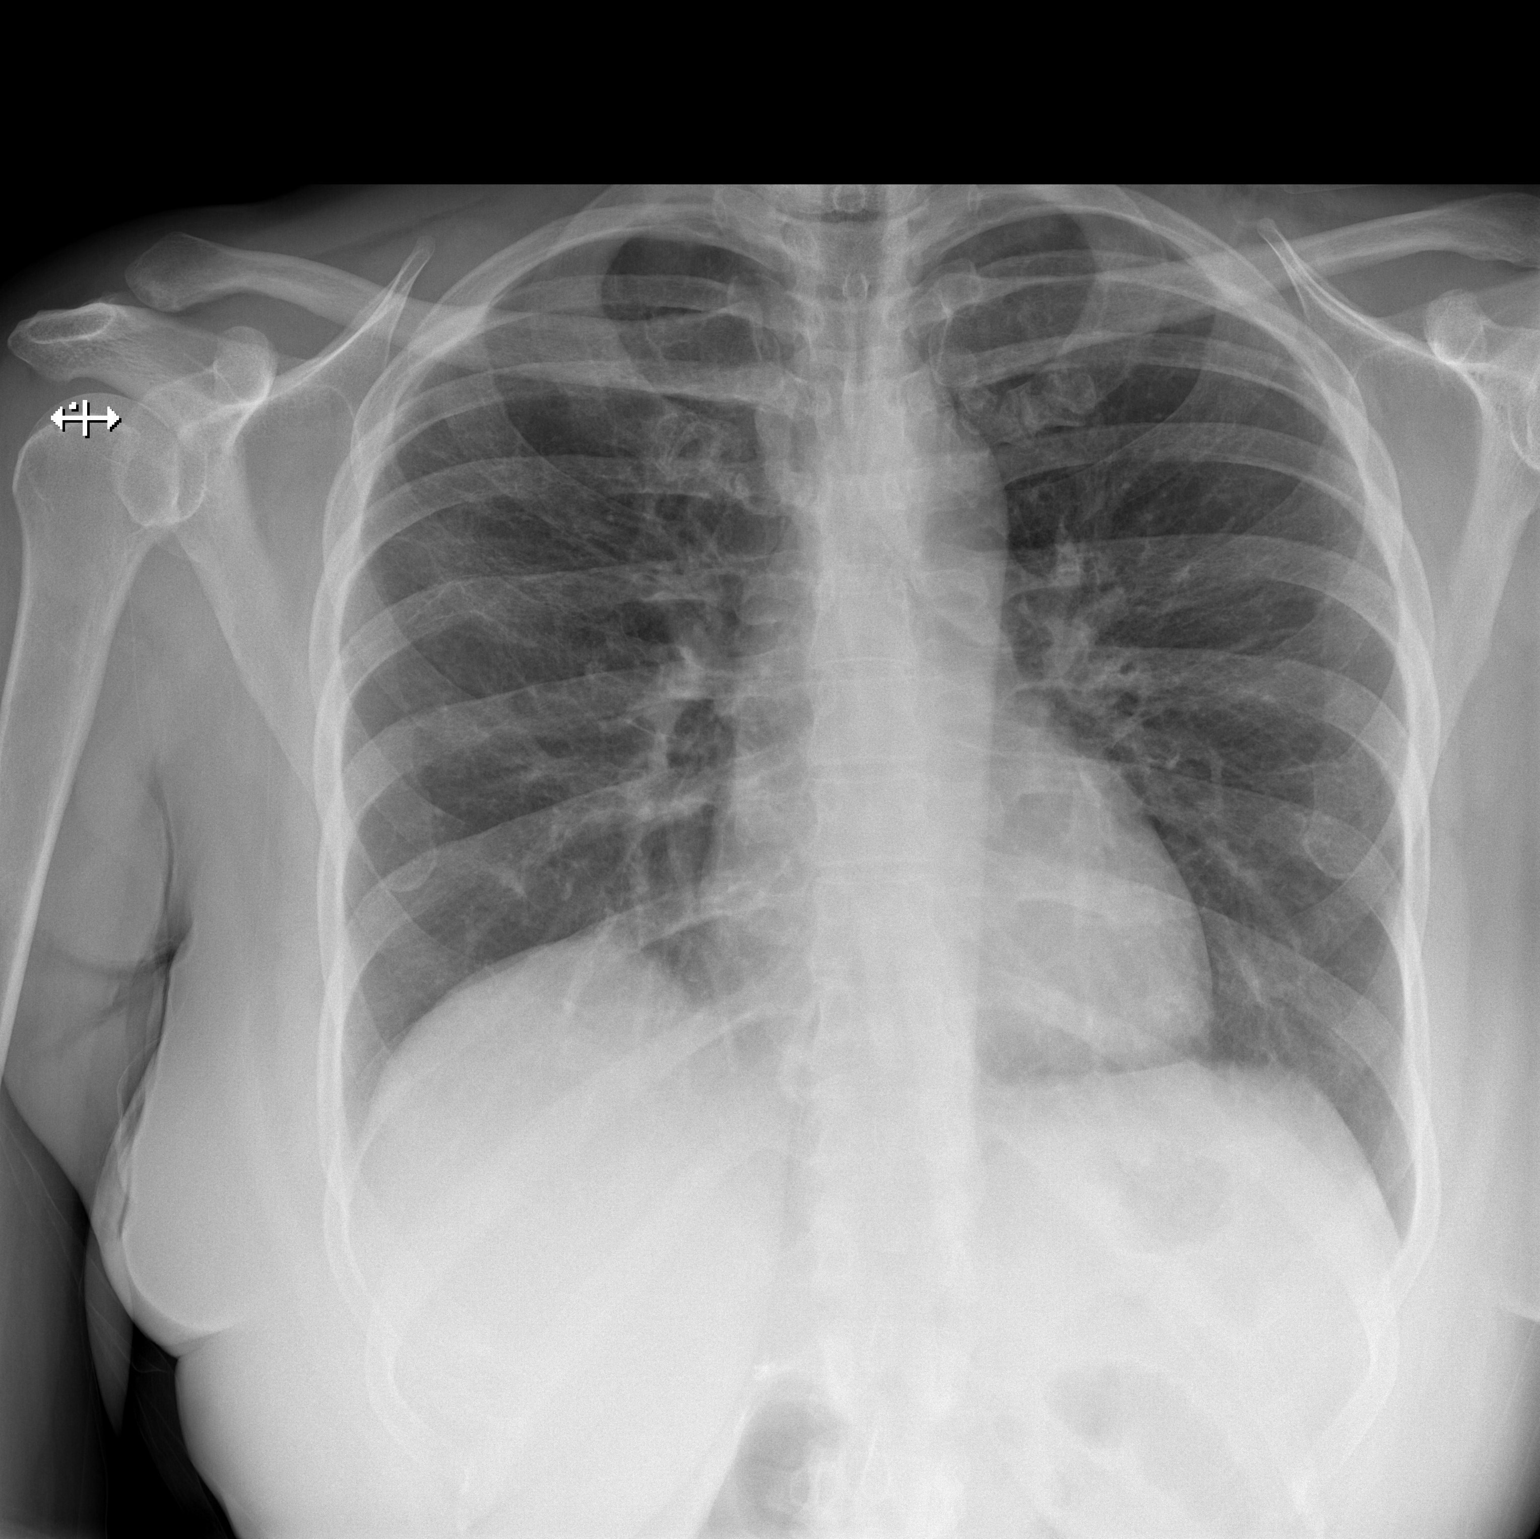

[w chest lat]
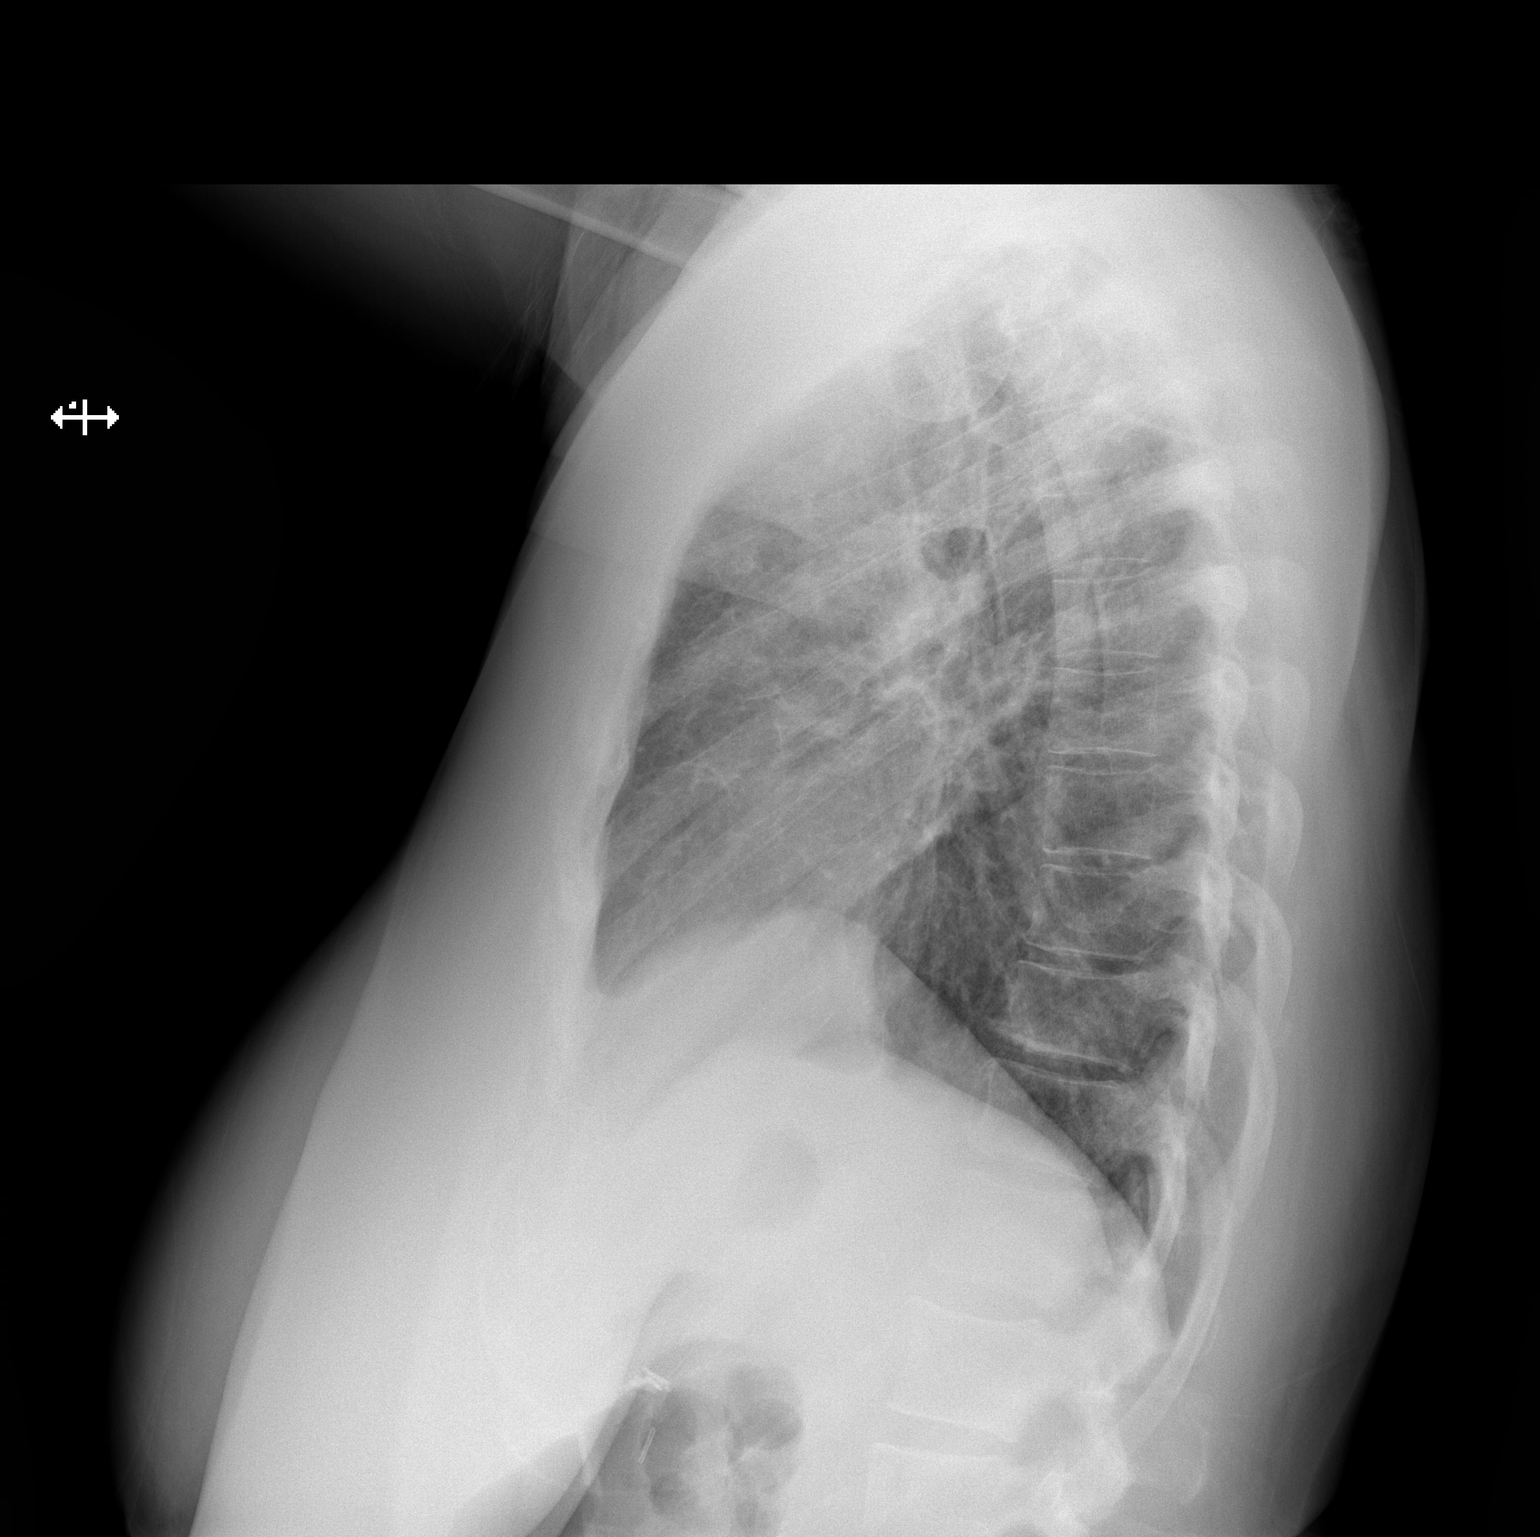

[2 of 2 positions shown; findings below may reference images not displayed]

FINDINGS: The heart size and mediastinal contours are within normal limits.
Both lungs are clear. The visualized skeletal structures are
unremarkable.
IMPRESSION: No active cardiopulmonary disease.

## 2019-01-28 IMAGING — RF DG HIP (WITH PELVIS) OPERATIVE*L*
1 series · 2 of 2 positions shown · non-contrast
Comparison: None.

CLINICAL DATA: Total left hip replacement

EXAM:
DG C-ARM 61-120 MIN; OPERATIVE LEFT HIP WITH PELVIS

[Series 1: run · 2 of 2 slices shown]
[im 1/2]
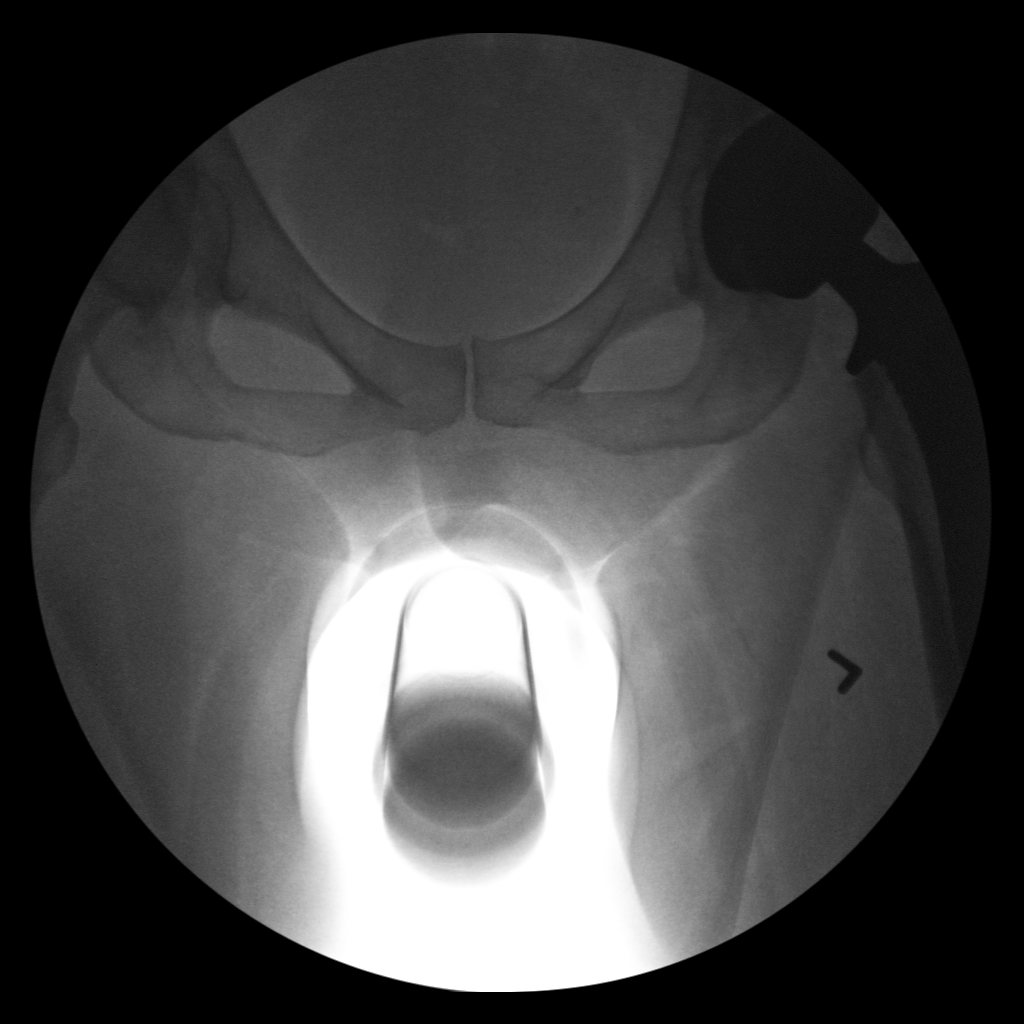
[im 2/2]
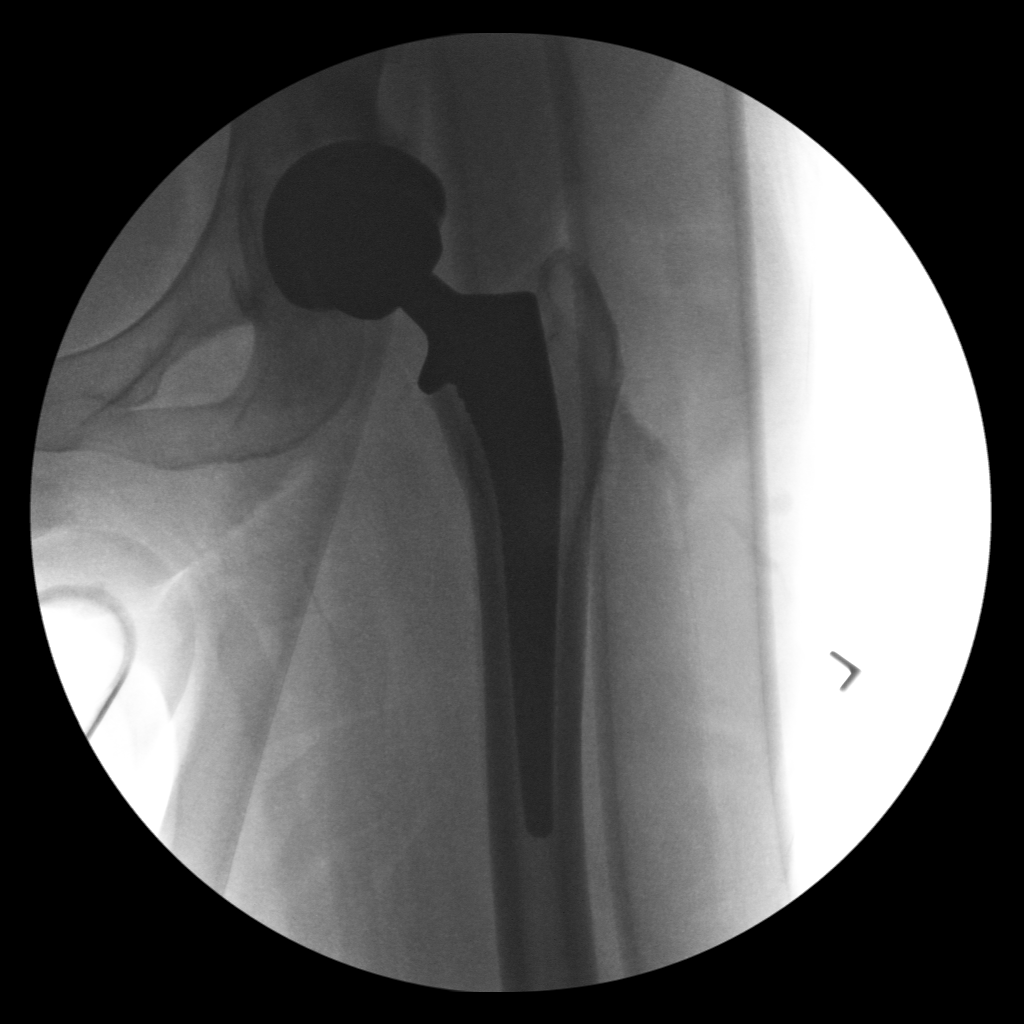

[2 of 2 positions shown; findings below may reference images not displayed]

FINDINGS: Changes of left hip replacement. Normal AP alignment. No visible
hardware or bony complicating feature.
IMPRESSION: Left hip replacement.  No visible complicating feature.
